# Patient Record
Sex: Male | Born: 1980
Health system: Southern US, Community
[De-identification: ages and names within clinical notes are randomized; demographics above are authoritative.]

## PROBLEM LIST (undated history)

## (undated) HISTORY — PX: BACK SURGERY: SHX140

## (undated) NOTE — *Deleted (*Deleted)
Family Medicine Teaching [facility] Admission History and Physical Service Pager: [phone]  Patient name: Gregory Foster Medical record number: [id number] Date of birth: [birth year] Age: 65 y.o. Gender: male  Primary Care Provider: Patient, No Pcp Per Consultants: None Code Status: FULL Preferred Emergency Contact: Nagee Goates (Wife) [phone]  Chief Complaint: Chest pain  Assessment and Plan: Gregory Foster is a 38 y.o. male presenting with chest pain. Patient has no known PMHx.  Exertional Chest Pain Patient presents with 2 weeks of worsening substernal chest pressure. He also endorses SOB and decreased exercise tolerance (able to walk 57ft before becoming fatigued with significant chest pain/SOB). On presentation patient is hypertensive with BP 155/101, but all other vitals wnl. Physical exam unremarkable, EKG unremarkable, CXR normal, and high sensitivity troponin negative x2 (3>3). He was given ASA 325mg  at urgent care, and nitro was ordered in the ED but not given yet. Differential diagnosis includes unstable angina (most likely), MI (less likely given negative troponin and EKG), musculoskeletal chest pain, PE (Wells score 0), aortic stenosis, CHF, GERD, and pneumonia (not seen on CXR). Patient is overall low risk for cardiac etiology (no family hx, nonsmoker, no known diabetes or HLD). -admit to med-tele, attending Dr. Deirdre Priest -Vitals per routine -OOB with assistance -Will obtain Lipid panel, Hemoglobin A1c -Repeat EKG in am -Consider echo -Consult cardiology in am -Will likely need stress test (possibly as outpatient) -PT/OT eval  Hypertension Patient has no diagnosed hx of HTN but has been checking his BP at home over the past 1-2 weeks where it has been elevated up to 180/129. BP in the ED has ranged from 140-166 systolic.  -Monitor BP -If persistently elevated, plan to start antihypertensive  Headache Patient complaining of bitemporal headache, described as a  pressure sensation. Most likely tension headache, but may also be related to hypertensive emergency given hx of significantly elevated BP at home. Low suspicion for intracranial pathology at this time. -Tylenol 1000mg  q6h prn   FEN/GI: Heart healthy diet Prophylaxis: Lovenox  Disposition: med-tele  History of Present Illness:  Gregory Foster is a 79 y.o. male, with no diagnosed PMH, presenting with progressive substernal chest pain for the past two weeks.     He is accompanied by his wife who provides additional history. Reports he has been feeling chest pressure for the past "two weeks or so" with exertion only. Last night felt winded with minimal exertion and like a "person was sitting on his chest." Minimal chest pain remaining right now, reports the pain is present at all times but becomes severe with exertion, associated with SOB. He is feeling the chest pressure and SOB quicker with less activity since this started (now reports he can walk about 60 ft before feeling tired), but that the pain is no more severe than when initially started. Baseline no restrictions. Significantly improves with rest. No radiation of pain. Has been checking BP at home- ranges from 160-180 systolic and 90-129 diastolic. Concurrent headache (all over), gets worse when his blood pressure is high. He tried potassium citrate to improve his symptoms with some improvement. Denies any fever, chills, cough, sore throat, diaphoresis. No known sick contacts, not vaccinated against COVID. No LE swelling or change in weight. No difference in pain after certain foods, but wife says he has been burping "more deep" recently.   He also reports some muscle cramping and "feeling weak in my knees" for the past two weeks. No preceding injury or trauma. This cramping started prior to chest pains. Doesn't  drink much water during the day. Non-smoker. Social alcohol use.   He had two teeth pulled on Monday, started on amoxicillin and  ibuprofen. 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 No family history of CAD/MI, does have HTN and T2DM in the family. Not on any medications at home, does not have a PCP.   Ed Course: Initially presented to On arrival he was hemodynamically stable and in no acute distress. EKG sinus without ischemic changes noted. Q wave present in lead III only.    Review Of Systems: Per HPI with the following additions:  Review of Systems  Constitutional: Negative for diaphoresis, fever and unexpected weight change.  HENT: Negative for congestion and sore throat.   Respiratory: Positive for shortness of breath. Negative for cough.   Cardiovascular: Positive for chest pain. Negative for leg swelling.  Gastrointestinal: Negative for abdominal pain, nausea and vomiting.  Skin: Negative for rash.  Neurological: Positive for headaches. Negative for syncope, weakness and numbness.     There are no problems to display for this patient.   Past Medical History: History reviewed. No pertinent past medical history.  Past Surgical History: Past Surgical History:  Procedure Laterality Date  . BACK SURGERY      Social History: Social History   Tobacco Use  . Smoking status: Nonsmoker  Substance Use Topics  . Alcohol use: Occasional  . Drug use: None    Family History: HTN in Mother Diabetes in Uncle  Allergies and Medications: No Known Allergies No current facility-administered medications on file prior to encounter.   Current Outpatient Medications on File Prior to Encounter  Medication Sig Dispense Refill  . amoxicillin (AMOXIL) 875 MG tablet Take 875 mg by mouth 2 (two) times daily.    Marland Kitchen ibuprofen (ADVIL) 600 MG tablet Take 600 mg by mouth every 6 (six) hours as needed for pain.      Objective: BP (!) 166/92 (BP Location: Right Arm)   Pulse (!) 57   Temp 97.8 F (36.6 C) (Oral)   Resp 15   SpO2 97%  Exam: General: alert,  well-appearing, NAD Eyes: PERRL, EOMI ENTM: moist mucous membranes Neck: supple, no cervical lymphadenopathy Cardiovascular: RRR, normal S1/S2 without m/r/g, chest wall nontender to palpation, no peripheral edema Respiratory: normal WOB, lungs CTAB Gastrointestinal: +BS, soft, nontender, nondistended MSK: full ROM in all extremities, 5/5 strength in bilateral upper and lower extremities, chest wall and bilateral shoulders nontender to palpation Derm: no rashes noted Neuro: CN II-VII intact, normal sensation throughout Psych: appropriate affect, normal speech  Labs and Imaging: CBC BMET  Recent Labs  Lab 07/07/20 1627  WBC 8.0  HGB 13.9  HCT 42.3  PLT 323   Recent Labs  Lab 07/07/20 1627  NA 136  K 3.8  CL 103  CO2 27  BUN 12  CREATININE 1.00  GLUCOSE 93  CALCIUM 9.4    Troponin: 3>3  EKG: NSR at 60 bpm, no ST-T changes  DG Chest 2 View Result Date: 07/07/2020 IMPRESSION: No active cardiopulmonary disease.    Maury Dus, MD 07/07/2020, 9:44 PM PGY-1, [facility] Family Medicine FPTS Intern pager: [phone], text pages welcome

---

## 2007-10-10 ENCOUNTER — Emergency Department (HOSPITAL_COMMUNITY): Admission: EM | Admit: 2007-10-10 | Discharge: 2007-10-10 | Payer: Self-pay | Admitting: Emergency Medicine

## 2008-08-21 HISTORY — PX: BACK SURGERY: SHX140

## 2011-05-15 LAB — URINALYSIS, ROUTINE W REFLEX MICROSCOPIC
Nitrite: NEGATIVE
Protein, ur: 30 — AB
Specific Gravity, Urine: 1.031 — ABNORMAL HIGH
Urobilinogen, UA: 1

## 2011-05-15 LAB — I-STAT 8, (EC8 V) (CONVERTED LAB)
Chloride: 103
HCT: 45
Potassium: 3.4 — ABNORMAL LOW
Sodium: 137
TCO2: 25
pH, Ven: 7.409 — ABNORMAL HIGH

## 2011-05-15 LAB — URINE CULTURE
Colony Count: NO GROWTH
Culture: NO GROWTH

## 2011-05-15 LAB — DIFFERENTIAL
Basophils Absolute: 0
Basophils Relative: 0
Lymphocytes Relative: 5 — ABNORMAL LOW
Monocytes Absolute: 0.7
Monocytes Relative: 4
Neutro Abs: 16.1 — ABNORMAL HIGH
Neutrophils Relative %: 91 — ABNORMAL HIGH

## 2011-05-15 LAB — URINE MICROSCOPIC-ADD ON

## 2011-05-15 LAB — CBC
Hemoglobin: 13.7
RBC: 4.74

## 2011-05-15 LAB — POCT I-STAT CREATININE
Creatinine, Ser: 1.1
Operator id: 114141

## 2020-07-07 ENCOUNTER — Emergency Department (HOSPITAL_COMMUNITY): Payer: Self-pay

## 2020-07-07 ENCOUNTER — Observation Stay (HOSPITAL_COMMUNITY)
Admission: EM | Admit: 2020-07-07 | Discharge: 2020-07-09 | Disposition: A | Discharge status: Home / Self Care | Payer: Self-pay | Attending: Family Medicine | Admitting: Family Medicine

## 2020-07-07 ENCOUNTER — Other Ambulatory Visit: Payer: Self-pay

## 2020-07-07 ENCOUNTER — Encounter (HOSPITAL_COMMUNITY): Payer: Self-pay | Admitting: Emergency Medicine

## 2020-07-07 DIAGNOSIS — R079 Chest pain, unspecified: Secondary | ICD-10-CM | POA: Diagnosis present

## 2020-07-07 DIAGNOSIS — Z20822 Contact with and (suspected) exposure to covid-19: Secondary | ICD-10-CM | POA: Insufficient documentation

## 2020-07-07 DIAGNOSIS — I200 Unstable angina: Principal | ICD-10-CM | POA: Insufficient documentation

## 2020-07-07 LAB — CBC
HCT: 42.3 % (ref 39.0–52.0)
Hemoglobin: 13.9 g/dL (ref 13.0–17.0)
MCH: 28.4 pg (ref 26.0–34.0)
MCHC: 32.9 g/dL (ref 30.0–36.0)
MCV: 86.5 fL (ref 80.0–100.0)
Platelets: 323 10*3/uL (ref 150–400)
RBC: 4.89 MIL/uL (ref 4.22–5.81)
RDW: 12.6 % (ref 11.5–15.5)
WBC: 8 10*3/uL (ref 4.0–10.5)
nRBC: 0 % (ref 0.0–0.2)

## 2020-07-07 LAB — TROPONIN I (HIGH SENSITIVITY)
Troponin I (High Sensitivity): 3 ng/L (ref <18)
Troponin I (High Sensitivity): 3 ng/L (ref <18)

## 2020-07-07 LAB — BASIC METABOLIC PANEL
Anion gap: 6 (ref 5–15)
BUN: 12 mg/dL (ref 6–20)
CO2: 27 mmol/L (ref 22–32)
Calcium: 9.4 mg/dL (ref 8.9–10.3)
Chloride: 103 mmol/L (ref 98–111)
Creatinine, Ser: 1 mg/dL (ref 0.61–1.24)
GFR, Estimated: >60 mL/min (ref >60)
Glucose, Bld: 93 mg/dL (ref 70–99)
Potassium: 3.8 mmol/L (ref 3.5–5.1)
Sodium: 136 mmol/L (ref 135–145)

## 2020-07-07 MED ORDER — ONDANSETRON HCL 4 MG/2ML IJ SOLN: 4.0000 mg | Freq: Four times a day (QID) | INTRAMUSCULAR | Status: DC | PRN

## 2020-07-07 MED ORDER — ENOXAPARIN SODIUM 40 MG/0.4ML ~~LOC~~ SOLN: 40.0000 mg | Freq: Every day | SUBCUTANEOUS | Status: DC

## 2020-07-07 MED ORDER — ACETAMINOPHEN 500 MG PO TABS
1000.0000 mg | ORAL_TABLET | Freq: Four times a day (QID) | ORAL | Status: DC | PRN
  Administered 2020-07-08 (×2): 1000 mg via ORAL
  Filled 2020-07-07 (×2): qty 2

## 2020-07-07 MED ORDER — NITROGLYCERIN 0.4 MG SL SUBL: 0.4000 mg | SUBLINGUAL_TABLET | SUBLINGUAL | Status: DC | PRN

## 2020-07-07 MED ORDER — AMOXICILLIN 500 MG PO CAPS
1000.0000 mg | ORAL_CAPSULE | Freq: Two times a day (BID) | ORAL | Status: DC
  Administered 2020-07-08 – 2020-07-09 (×4): 1000 mg via ORAL
  Filled 2020-07-07 (×5): qty 2

## 2020-07-07 NOTE — ED Provider Notes (Signed)
Gregory Foster Provider Note  CSN: [id number] Arrival date & time: 07/07/20 1613    History Chief Complaint  Patient presents with  . Chest Pain    HPI  Gregory Foster is a 39 y.o. male with no significant PMH (although he also rarely goes to the doctor) presents from [facility] for evaluation of chest pain. He reports at work about a week ago he had an episode of pain in his L upper back and shoulder, worse with arm movement that resolved after about a day. He has also had midsternal chest pressure, associated with SOB, worse with exertion and improved with rest. He reports some continued mild pressure at the time of my evaluation. Denies any fever, cough, nausea or diaphoresis. He has had elevated BP readings at home. Was seen at [facility] today, given ASA x 4 and referred to the ED.    History reviewed. No pertinent past medical history.  Past Surgical History:  Procedure Laterality Date  . BACK SURGERY      No family history on file.  Social History   Tobacco Use  . Smoking status: Not on file  Substance Use Topics  . Alcohol use: Not on file  . Drug use: Not on file     Home Medications Prior to Admission medications   Medication Sig Start Date End Date Taking? Authorizing Provider  amoxicillin (AMOXIL) 875 MG tablet Take 875 mg by mouth 2 (two) times daily. 07/05/20  Yes [provider]  ibuprofen (ADVIL) 600 MG tablet Take 600 mg by mouth every 6 (six) hours as needed for pain. 07/05/20  Yes [provider]     Allergies    Patient has no known allergies.   Review of Systems   Review of Systems A comprehensive review of systems was completed and negative except as noted in HPI.    Physical Exam BP (!) 141/84 (BP Location: Right Arm)   Pulse 65   Temp 97.8 F (36.6 C) (Oral)   Resp 20   SpO2 92%   Physical Exam Vitals and nursing note reviewed.  Constitutional:      Appearance: Normal appearance.  HENT:     Head:  Normocephalic and atraumatic.     Nose: Nose normal.     Mouth/Throat:     Mouth: Mucous membranes are moist.  Eyes:     Extraocular Movements: Extraocular movements intact.     Conjunctiva/sclera: Conjunctivae normal.  Cardiovascular:     Rate and Rhythm: Normal rate.  Pulmonary:     Effort: Pulmonary effort is normal.     Breath sounds: Normal breath sounds.  Abdominal:     General: Abdomen is flat.     Palpations: Abdomen is soft.     Tenderness: There is no abdominal tenderness.  Musculoskeletal:        General: No swelling. Normal range of motion.     Cervical back: Neck supple.  Skin:    General: Skin is warm and dry.  Neurological:     General: No focal deficit present.     Mental Status: He is alert.  Psychiatric:        Mood and Affect: Mood normal.      ED Results / Procedures / Treatments   Labs (all labs ordered are listed, but only abnormal results are displayed) Labs Reviewed  RESPIRATORY PANEL BY RT PCR (FLU A&B, COVID)  BASIC METABOLIC PANEL  CBC  LIPID PANEL  HEMOGLOBIN A1C  TROPONIN I (HIGH  SENSITIVITY)  TROPONIN I (HIGH SENSITIVITY)    EKG EKG Interpretation  Date/Time:  Wednesday July 07 2020 16:16:57 EST Ventricular Rate:  64 PR Interval:  152 QRS Duration: 102 QT Interval:  410 QTC Calculation: 422 R Axis:   95 Text Interpretation: Normal sinus rhythm Rightward axis Nonspecific ST abnormality Abnormal ECG No old tracing to compare Confirmed by [name] [phone]) on 07/07/2020 8:47:17 PM   Radiology DG Chest 2 View  Result Date: 07/07/2020 CLINICAL DATA:  Chest pain EXAM: CHEST - 2 VIEW COMPARISON:  None. FINDINGS: The heart size and mediastinal contours are within normal limits. Both lungs are clear. The visualized skeletal structures are unremarkable. IMPRESSION: No active cardiopulmonary disease. Electronically Signed   By: Charlett Nose M.D.   On: 07/07/2020 17:37    Procedures Procedures  Medications Ordered in the  ED Medications  nitroGLYCERIN (NITROSTAT) SL tablet 0.4 mg (has no administration in time range)     MDM Rules/Calculators/A&P MDM Patient with no known risk factors, but also rarely goes to the doctor here with concerning story for anginal chest pain. Initial EKG without ischemic changes and first two trop are neg. Given his continued mild 1-2/10 chest pressure, will give NTG and admit for further evaluation.  ED Course  I have reviewed the triage vital signs and the nursing notes.  Pertinent labs & imaging results that were available during my care of the patient were reviewed by me and considered in my medical decision making (see chart for details).  Clinical Course as of Jul 07 2218  Wed Jul 07, 2020  2126 CBC, Trop, BMP and CXR all normal.    [CS]  2158 Spoke with Dr. Anner Crete, FM Resident who will evaluate for admission.    [CS]    Clinical Course User Index [CS] Pollyann Savoy, MD    Final Clinical Impression(s) / ED Diagnoses Final diagnoses:  Unstable angina [facility])    Rx / DC Orders ED Discharge Orders    None       Pollyann Savoy, MD 07/07/20 2219

## 2020-07-07 NOTE — ED Triage Notes (Signed)
Pt here pov from UC with co cp x1 week upon activity. When pt rests his pain subsides.

## 2020-07-07 NOTE — H&P (Addendum)
Family Medicine Teaching [facility] Admission History and Physical Service Pager: [phone]  Patient name: Gregory Foster Medical record number: [id number] Date of birth: 29-Aug-1980 Age: 39 y.o. Gender: male  Primary Care Provider: Patient, No Pcp Per Consultants: None Code Status: FULL Preferred Emergency Contact: Alexandra Posadas (Wife) [phone]  Chief Complaint: Chest pain  Assessment and Plan: Gregory Foster is a 39 y.o. male presenting with chest pain. Patient has no known PMHx.  Exertional Chest Pain: Acute.  Patient presents with 2 weeks of worsening exertional substernal chest pressure. He also endorses SOB and decreased activity tolerance, resolves at rest. On presentation patient is hypertensive with BP 155/101, but all other vitals wnl. He has little to no chest pain currently. Physical exam unremarkable, EKG without ischemic changes, CXR normal, and high sensitivity troponin negative x2 (3>3). He was given ASA 325mg  at urgent care, and nitro was ordered in the ED but not given yet. His presentation is most concerning for anginal pain/CAD (most likely). Heart score of 2, patient is overall low risk for cardiac etiology (no family or personal hx, nonsmoker, no known diabetes or HLD). However, differential also includes MI (less likely given negative troponin x2 and EKG wnl), 2/2 uncontrolled hypertension, musculoskeletal chest pain, PE (Wells score 0), CHF (however no additonal stigmata suggestive of this), GERD, and pneumonia (doubt, afebrile, not seen on CXR).  -Admit to cardiac-tele, attending Dr. -Vitals per routine -OOB with assistance -Will obtain Lipid panel, Hemoglobin A1c  -Repeat EKG in am, or PRN chest pain  -Consult cardiology in am for further evaluation  -Can consider Echo, however will wait until cardiology discussion  -PT eval  Elevated blood pressure w/o diagnosis of hypertension Patient has no diagnosed hx of HTN but has been checking his BP at home  over the past 1-2 weeks where it has been elevated up to 180/129. Current BP 134/87, ranging 130-160 since arrival.  -Monitor BP -If persistently elevated, plan to start antihypertensive (as expect underlying hypertension)   Headache Patient complaining of bitemporal headache, described as a pressure sensation. Most likely tension headache, but may also be related to hypertension given hx of significantly elevated BP at home. Low suspicion for intracranial pathology at this time, neurologically intact. -Tylenol 1000mg  q6h prn -kpad or ice PRN   Recent tooth removal:  Reports two teeth extractions on this Monday, 11/15, and subsequently initiated on 5 day course of amoxicillin. No current gum irritation or pain.  --Cont amoxicillin BID (11/15-11/19)   FEN/GI: Heart healthy diet Prophylaxis: Lovenox  Disposition: med-tele  History of Present Illness:  Gregory Foster is a 39 y.o. male, with no diagnosed PMH, presenting with progressive substernal chest pain for the past two weeks.     He is accompanied by his wife who provides additional history. Reports he has been feeling chest pressure for the past "two weeks or so" with exertion only. Last night felt winded with minimal exertion and like a "person was sitting on his chest." Minimal chest pain remaining right now, reports the pain is present at all times but becomes severe with exertion, associated with SOB. He is feeling the chest pressure and SOB quicker with less activity since this started (now reports he can walk about 60 ft before feeling tired), but that the pain is no more severe than when initially started. Baseline no restrictions. Resolves with rest. No radiation of pain. Has been checking BP at home- ranges from 160-180 systolic and 90-129 diastolic. Concurrent headache (all over), gets worse when  his blood pressure is high. He tried potassium citrate to improve his symptoms with some improvement. Denies any fever, chills, cough, sore  throat, diaphoresis, N/V. No known sick contacts, not vaccinated against COVID. No LE swelling or change in weight. No difference in pain after certain foods, but wife says he has been burping "more deep" recently.   He also reports some muscle cramping and "feeling weak in my knees" for the past two weeks. No preceding injury or trauma. This cramping started prior to chest pains. Doesn't drink much water during the day. Non-smoker. Social alcohol use.   He had two teeth pulled on Monday, started on amoxicillin and ibuprofen. 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 No family history of CAD/MI, does have HTN and T2DM in the family. Not on any medications at home, does not have a PCP.   Ed Course: Initially presented to UC, given ASA and sent to ED for further evaluation. On arrival he was hemodynamically stable and in no acute distress. EKG sinus without ischemic changes noted. Q wave present in lead III only. HS Troponin <3 x2. Heart score of 2. FPTS team was consulted for admission for further observation overnight given typical anginal symptoms.    Review Of Systems: Per HPI with the following additions:  Review of Systems  Constitutional: Negative for diaphoresis, fever and unexpected weight change.  HENT: Negative for congestion and sore throat.   Respiratory: Positive for shortness of breath. Negative for cough.   Cardiovascular: Positive for chest pain. Negative for leg swelling.  Gastrointestinal: Negative for abdominal pain, nausea and vomiting.  Skin: Negative for rash.  Neurological: Positive for headaches. Negative for syncope, weakness and numbness.     Patient Active Problem List   Diagnosis Date Noted  . Chest pain 07/07/2020    Past Medical History: History reviewed. No pertinent past medical history.  Past Surgical History: Past Surgical History:  Procedure Laterality Date  . BACK SURGERY      Social  History: Social History   Tobacco Use  . Smoking status: Nonsmoker  Substance Use Topics  . Alcohol use: Occasional  . Drug use: None    Family History: HTN in Mother Diabetes in Uncle  Allergies and Medications: No Known Allergies No current facility-administered medications on file prior to encounter.   Current Outpatient Medications on File Prior to Encounter  Medication Sig Dispense Refill  . amoxicillin (AMOXIL) 875 MG tablet Take 875 mg by mouth 2 (two) times daily.    Marland Kitchen ibuprofen (ADVIL) 600 MG tablet Take 600 mg by mouth every 6 (six) hours as needed for pain.      Objective: BP 135/82   Pulse 61   Temp 97.8 F (36.6 C) (Oral)   Resp 13   SpO2 94%  Exam: General: alert, well-appearing, NAD laying comfortably  Eyes: PERRL, EOMI ENTM: moist mucous membranes Neck: supple, no cervical lymphadenopathy Cardiovascular: RRR, normal S1/S2 without m/r/g, chest wall nontender to palpation, no peripheral edema Respiratory: normal WOB, lungs CTAB Gastrointestinal: +BS, soft, nontender, nondistended MSK: full ROM in all extremities, 5/5 strength in bilateral upper and lower extremities, chest wall and bilateral shoulders nontender to palpation Derm: no rashes noted Neuro: Alert and oriented, CN II-VII intact, normal sensation throughout Psych: appropriate affect, normal speech  Labs and Imaging: CBC BMET  Recent Labs  Lab 07/07/20 1627  WBC 8.0  HGB 13.9  HCT 42.3  PLT 323  Recent Labs  Lab 07/07/20 1627  NA 136  K 3.8  CL 103  CO2 27  BUN 12  CREATININE 1.00  GLUCOSE 93  CALCIUM 9.4    Troponin: 3>3  EKG: NSR at 60 bpm, no ST-T changes  DG Chest 2 View Result Date: 07/07/2020 IMPRESSION: No active cardiopulmonary disease.   Maury Dus, MD 07/08/2020, 12:04 AM PGY-1, [facility] Family Medicine FPTS Intern pager: [phone], text pages welcome  FPTS Upper-Level Resident Addendum   I have independently interviewed and examined the patient.  I have discussed the above with the original author and agree with their documentation. My edits for correction/addition/clarification are added. Please see also any attending notes.    Allayne Stack, DO  Family Medicine PGY-3

## 2020-07-08 ENCOUNTER — Observation Stay (HOSPITAL_BASED_OUTPATIENT_CLINIC_OR_DEPARTMENT_OTHER): Payer: Self-pay

## 2020-07-08 ENCOUNTER — Observation Stay (HOSPITAL_COMMUNITY): Payer: Self-pay

## 2020-07-08 DIAGNOSIS — I200 Unstable angina: Secondary | ICD-10-CM

## 2020-07-08 DIAGNOSIS — I10 Essential (primary) hypertension: Secondary | ICD-10-CM

## 2020-07-08 DIAGNOSIS — R079 Chest pain, unspecified: Secondary | ICD-10-CM

## 2020-07-08 DIAGNOSIS — R0602 Shortness of breath: Secondary | ICD-10-CM

## 2020-07-08 LAB — BASIC METABOLIC PANEL
Anion gap: 9 (ref 5–15)
BUN: 13 mg/dL (ref 6–20)
CO2: 27 mmol/L (ref 22–32)
Calcium: 9.5 mg/dL (ref 8.9–10.3)
Chloride: 104 mmol/L (ref 98–111)
Creatinine, Ser: 1.02 mg/dL (ref 0.61–1.24)
GFR, Estimated: >60 mL/min (ref >60)
Glucose, Bld: 106 mg/dL — ABNORMAL HIGH (ref 70–99)
Potassium: 4 mmol/L (ref 3.5–5.1)
Sodium: 140 mmol/L (ref 135–145)

## 2020-07-08 LAB — ECHOCARDIOGRAM COMPLETE
Area-P 1/2: 3.77 cm2
Height: 71 in
S' Lateral: 3.4 cm
Weight: 4566.4 oz

## 2020-07-08 LAB — RESPIRATORY PANEL BY RT PCR (FLU A&B, COVID)
Influenza A by PCR: NEGATIVE
Influenza B by PCR: NEGATIVE
SARS Coronavirus 2 by RT PCR: NEGATIVE

## 2020-07-08 LAB — HEMOGLOBIN A1C
Hgb A1c MFr Bld: 5.6 % (ref 4.8–5.6)
Mean Plasma Glucose: 114.02 mg/dL

## 2020-07-08 LAB — MAGNESIUM: Magnesium: 2.2 mg/dL (ref 1.7–2.4)

## 2020-07-08 LAB — LIPID PANEL
Cholesterol: 165 mg/dL (ref 0–200)
HDL: 24 mg/dL — ABNORMAL LOW (ref >40)
LDL Cholesterol: 114 mg/dL — ABNORMAL HIGH (ref 0–99)
Total CHOL/HDL Ratio: 6.9 RATIO
Triglycerides: 135 mg/dL (ref <150)
VLDL: 27 mg/dL (ref 0–40)

## 2020-07-08 LAB — HIV ANTIBODY (ROUTINE TESTING W REFLEX): HIV Screen 4th Generation wRfx: NONREACTIVE

## 2020-07-08 MED ORDER — NITROGLYCERIN 0.4 MG SL SUBL
SUBLINGUAL_TABLET | SUBLINGUAL | Status: AC
  Administered 2020-07-08: 0.8 mg
  Filled 2020-07-08: qty 2

## 2020-07-08 MED ORDER — IOHEXOL 350 MG/ML SOLN
80.0000 mL | Freq: Once | INTRAVENOUS | Status: AC | PRN
  Administered 2020-07-08: 80 mL via INTRAVENOUS

## 2020-07-08 MED ORDER — LOSARTAN POTASSIUM 25 MG PO TABS
25.0000 mg | ORAL_TABLET | Freq: Every day | ORAL | 0 refills | Status: DC
Start: 2020-07-09 — End: 2020-07-09

## 2020-07-08 MED ORDER — LOSARTAN POTASSIUM 25 MG PO TABS
25.0000 mg | ORAL_TABLET | Freq: Every day | ORAL | Status: DC
  Administered 2020-07-08 – 2020-07-09 (×2): 25 mg via ORAL
  Filled 2020-07-08 (×2): qty 1

## 2020-07-08 MED ORDER — ENOXAPARIN SODIUM 80 MG/0.8ML ~~LOC~~ SOLN
65.0000 mg | Freq: Every day | SUBCUTANEOUS | Status: DC
  Administered 2020-07-08 – 2020-07-09 (×2): 65 mg via SUBCUTANEOUS
  Filled 2020-07-08 (×2): qty 0.8

## 2020-07-08 MED ORDER — METOPROLOL TARTRATE 25 MG PO TABS
25.0000 mg | ORAL_TABLET | ORAL | Status: AC
  Administered 2020-07-08: 25 mg via ORAL
  Filled 2020-07-08: qty 1

## 2020-07-08 NOTE — Consult Note (Signed)
CARDIOLOGY CONSULT NOTE       Patient ID: Ruhaan Nordahl MRN: [id number] DOB/AGE: 03-23-1981 39 y.o.  Admit date: 07/07/2020 Referring Physician: Annia Friendly Primary Physician: Patient, No Pcp Per Primary Cardiologist: New Reason for Consultation: Chest Pain, Dyspnea  Active Problems:   Chest pain   HPI:  39 y.o. no cardiac history Likely HTN not RX. Been ill for a week. Started with muscular sounding pain in neck and left shoulder spread to chest. Exertional and rest. Started to get dyspnea. No cough fever or sick contacts. Feels fatigue and weak. 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 Not pleuritic no wheezing or history of asthma/smoking Legs feel heavy but no pain / edema. In ER CXR NAD ECG normal Troponin negative x 2. Headache with elevated BP. Had tooth extraction Monday 11/15 and has been on amoxicillin. No history of murmur SBE, rheumatic fever. No signs of systemic infection.   ROS All other systems reviewed and negative except as noted above  History reviewed. No pertinent past medical history.  No family history on file.  Social History   Socioeconomic History  . Marital status: Married    Spouse name: Not on file  . Number of children: Not on file  . Years of education: Not on file  . Highest education level: Not on file  Occupational History  . Not on file  Tobacco Use  . Smoking status: Not on file  Substance and Sexual Activity  . Alcohol use: Not on file  . Drug use: Not on file  . Sexual activity: Not on file  Other Topics Concern  . Not on file  Social History Narrative  . Not on file   Social Determinants of Health   Financial Resource Strain:   . Difficulty of Paying Living Expenses: Not on file  Food Insecurity:   . Worried About Programme researcher, broadcasting/film/video in the Last Year: Not on file  . Ran Out of Food in the Last Year: Not on file  Transportation Needs:   . Lack of  Transportation (Medical): Not on file  . Lack of Transportation (Non-Medical): Not on file  Physical Activity:   . Days of Exercise per Week: Not on file  . Minutes of Exercise per Session: Not on file  Stress:   . Feeling of Stress : Not on file  Social Connections:   . Frequency of Communication with Friends and Family: Not on file  . Frequency of Social Gatherings with Friends and Family: Not on file  . Attends Religious Services: Not on file  . Active Member of Clubs or Organizations: Not on file  . Attends Banker Meetings: Not on file  . Marital Status: Not on file  Intimate Partner Violence:   . Fear of Current or Ex-Partner: Not on file  . Emotionally Abused: Not on file  . Physically Abused: Not on file  . Sexually Abused: Not on file    Past Surgical History:  Procedure Laterality Date  . BACK SURGERY        Current Facility-Administered Medications:  .  acetaminophen (TYLENOL) tablet 1,000 mg, 1,000 mg, Oral, Q6H PRN, Leticia Penna N, DO, 1,000 mg at 07/08/20 0115 .  amoxicillin (AMOXIL) capsule 1,000 mg, 1,000 mg, Oral, BID, Beard, Samantha N, DO, 1,000 mg at 07/08/20 0208 .  enoxaparin (LOVENOX) injection 65 mg, 65 mg, Subcutaneous, Daily, Fayette Pho, MD .  metoprolol tartrate (LOPRESSOR) tablet 25 mg, 25 mg, Oral, NOW, Rosezella Kronick C, MD .  nitroGLYCERIN (NITROSTAT) SL tablet 0.4 mg, 0.4 mg, Sublingual, Q5 min PRN, Beard, Samantha N, DO .  ondansetron (ZOFRAN) injection 4 mg, 4 mg, Intravenous, Q6H PRN, Beard, Samantha N, DO . amoxicillin  1,000 mg Oral BID  . enoxaparin (LOVENOX) injection  65 mg Subcutaneous Daily  . metoprolol tartrate  25 mg Oral NOW     Physical Exam: Blood pressure (!) 141/77, pulse 60, temperature 97.8 F (36.6 C), temperature source Oral, resp. rate 16, height 5\' 11"  (1.803 m), weight 129.5 kg, SpO2 98 %.   Affect appropriate Healthy:  appears stated age HEENT: normal Neck supple with no adenopathy JVP normal  no bruits no thyromegaly Lungs clear with no wheezing and good diaphragmatic motion Heart:  S1/S2 no murmur, no rub, gallop or click PMI normal Abdomen: benighn, BS positve, no tenderness, no AAA no bruit.  No HSM or HJR Distal pulses intact with no bruits No edema Neuro non-focal Skin warm and dry No muscular weakness   Labs:   Lab Results  Component Value Date   WBC 8.0 07/07/2020   HGB 13.9 07/07/2020   HCT 42.3 07/07/2020   MCV 86.5 07/07/2020   PLT 323 07/07/2020    Recent Labs  Lab 07/08/20 0233  NA 140  K 4.0  CL 104  CO2 27  BUN 13  CREATININE 1.02  CALCIUM 9.5  GLUCOSE 106*   No results found for: CKTOTAL, CKMB, CKMBINDEX, TROPONINI  Lab Results  Component Value Date   CHOL 165 07/08/2020   Lab Results  Component Value Date   HDL 24 (L) 07/08/2020   Lab Results  Component Value Date   LDLCALC 114 (H) 07/08/2020   Lab Results  Component Value Date   TRIG 135 07/08/2020   Lab Results  Component Value Date   CHOLHDL 6.9 07/08/2020   No results found for: LDLDIRECT    Radiology: DG Chest 2 View  Result Date: 07/07/2020 CLINICAL DATA:  Chest pain EXAM: CHEST - 2 VIEW COMPARISON:  None. FINDINGS: The heart size and mediastinal contours are within normal limits. Both lungs are clear. The visualized skeletal structures are unremarkable. IMPRESSION: No active cardiopulmonary disease. Electronically Signed   By: 07/09/2020 M.D.   On: 07/07/2020 17:37    EKG: NSR normal no acute ST changes or signs of pericarditis    ASSESSMENT AND PLAN:   1. Chest pain. Normal ECG, negative troponin. Normal CXR. Persistent and worsening Favor cardiac CTA to risk stratify as his HR is relatively low and regular Discussed with patient will arrange Needs lopressor 25 mg oral and 18 g iv No contrast allergy Warned him nitro may make headache worse again  2. Dyspnea:  Normal exam and CXR Echo to r/o viral DCM check RV/LV function doubt PE R./O SBE with recent dental  surgery no murmur on exam   3. Headache:  ? Tension per primary service no focal signs no need for CT head Rx tylenol and control BP  4. HTN:  Start Cozaar 25 mg daily   Signed: 07/09/2020 39 07/08/2020, 10:09 AM

## 2020-07-08 NOTE — Hospital Course (Addendum)
Gregory Foster is a 39 y.o. male presenting with chest pain. Patient has no known PMHx but also has not visited a PCP in quite some time.    Exertional Chest Pain: Acute.  Patient presents with 2 weeks of worsening exertional substernal chest pressure. He also endorses SOB and decreased activity tolerance, resolves at rest. On presentation patient is hypertensive with BP 155/101, but all other vitals wnl. He has little to no chest pain currently. Physical exam unremarkable, EKG without ischemic changes, CXR normal, and high sensitivity troponin negative x2 (3>3). He was given ASA 383m at urgent care, and nitro was ordered in the ED. His presentation is most concerning for anginal pain/CAD (most likely). Heart score of 2, patient is overall low risk for cardiac etiology (no family or personal hx, nonsmoker, no known diabetes or HLD). However, differential also includes MI (less likely given negative troponin x2 and EKG wnl), 2/2 uncontrolled hypertension, musculoskeletal chest pain, PE (Wells score 0), CHF (however no additonal stigmata suggestive of this), GERD, and pneumonia (doubt, afebrile, not seen on CXR).  Patient was admitted to cardiac telemetry floor.  Cardiology was consulted, performed echo and cardiac CTA.  Echo demonstrated EF 60 to 65%, no regional wall motion normalities of left ventricle, normal LV function, normal RV function, normal valve structure, normal IVC with appropriate respiratory variability. Cardiac CTA showed no calcium in arteries (calcium score 0), right dominant coronary artery structure with no anomalies, CAD rads0. Also showed all vessels are normal. Patient required no oxygen while admitted.  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  Cardiology recommended starting losartan 20 mg daily, prescription sent to pharmacy.  Patient should follow up with cardiology outpatient.    Elevated blood pressure w/o diagnosis of  hypertension Patient has no diagnosed hx of HTN but has been checking his BP at home over the past 1-2 weeks where it has been elevated up to 180/129.  Blood pressure during admission ranged (118-166)/(62-92) with pulse 54-67.  Elevated BPs mostly 140s / 70-80s.  Cardiology recommended starting losartan 5 mg daily as mentioned above.    AMA Patient left AMA the afternoon of Friday 11/19. Friday morning during prerounds, resident met patient's wife for the first time at bedside. She relayed dissatisfaction with the work-up thus far and not yet having the chance to reproduce the chest pain with exertion. She was adamant about "something being wrong" and that perhaps we need to expand our search.  We discussed the good news that there is no evidence of acute myocardial infarction, structural issues, coronary artery calcification, or altered heart function.  I assured them that the attending would visit after rounds to discuss the work-up thus far and potential further work-up.  Later around lunch, attending visited patient and his wife to discuss everything.  Attending also ambulated patient in hallway and measured blood pressure and pulse ox after, was unable to reproduce anginal chest pain.  Patient and his wife were calm and agreeable at the time, did not voice any additional complaints.  About an hour later, the nurse called the attending back to the room because the patient and his wife had become upset.  Upon entering the room, the attending found the patient and his wife with elevated voices; the patient made a vague threat toward the attending.  Patient and wife then proceeded to collect their belongings and walk out without signing [email or web address].

## 2020-07-08 NOTE — Discharge Instructions (Addendum)
Angina ° °Angina is very bad discomfort or pain in the chest, neck, arm, jaw, or back. The discomfort is caused by a lack of blood in the middle layer of the heart wall (myocardium). °What are the causes? °This condition is caused by a buildup of fat and cholesterol (plaque) in your arteries (atherosclerosis). This buildup narrows the arteries and makes it hard for blood to flow. °What increases the risk? °You are more likely to develop this condition if: °· You have high levels of cholesterol in your blood. °· You have high blood pressure (hypertension). °· You have diabetes. °· You have a family history of heart disease. °· You are not active, or you do not exercise enough. °· You feel sad (depressed). °· You have been treated with high energy rays (radiation) on the left side of your chest. °Other risk factors are: °· Using tobacco. °· Being very overweight (obese). °· Eating a diet high in unhealthy fats (saturated fats). °· Having stress, or being exposed to things that cause stress. °· Using drugs, such as cocaine. °Women have a greater risk for angina if: °· They are older than 55. °· They have stopped having their period (are in postmenopause). °What are the signs or symptoms? °Foster symptoms of this condition in both men and women may include: °· Chest pain, which may: °? Feel like a crushing or squeezing in the chest. °? Feel like a tightness, pressure, fullness, or heaviness in the chest. °? Last for more than a few minutes at a time. °? Stop and come back (recur) after a few minutes. °· Pain in the neck, arm, jaw, or back. °· Heartburn or upset stomach (indigestion) for no reason. °· Being short of breath. °· Feeling sick to your stomach (nauseous). °· Sudden cold sweats. °Women and people with diabetes may have other symptoms that are not usual, such as feeling: °· Tired (fatigue). °· Worried or nervous (anxious) for no reason. °· Weak for no reason. °· Dizzy or passing out (fainting). °How is this  treated? °This condition may be treated with: °· Medicines. These are given to: °? Prevent blood clots. °? Prevent heart attack. °? Relax blood vessels and improve blood flow to the heart (nitrates). °? Reduce blood pressure. °? Improve the pumping action of the heart. °? Reduce fat and cholesterol in the blood. °· A procedure to widen a narrowed or blocked artery in the heart (angioplasty). °· Surgery to allow blood to go around a blocked artery (coronary artery bypass surgery). °Follow these instructions at home: °Medicines °· Take over-the-counter and prescription medicines only as told by your doctor. °· Do not take these medicines unless your doctor says that you can: °? NSAIDs. These include: °§ Ibuprofen. °§ Naproxen. °? Vitamin supplements that have vitamin A, vitamin E, or both. °? Hormone therapy that contains estrogen with or without progestin. °Eating and drinking ° °· Eat a heart-healthy diet that includes: °? Lots of fresh fruits and vegetables. °? Whole grains. °? Low-fat (lean) protein. °? Low-fat dairy products. °· Follow instructions from your doctor about what you cannot eat or drink. °Activity °· Follow an exercise program that your doctor tells you. °· Talk with your doctor about joining a program to help improve the health of your heart (cardiac rehab). °· When you feel tired, take a break. Plan breaks if you know you are going to feel tired. °Lifestyle ° °· Do not use any products that contain nicotine or tobacco. This includes cigarettes, e-cigarettes, and   and chewing tobacco. If you need help quitting, ask your doctor.  If your doctor says you can drink alcohol: ? Limit how much you use to:  0-1 drink a day for women who are not pregnant.  0-2 drinks a day for men. ? Be aware of how much alcohol is in your drink. In the [place] one drink equals:  One 12 oz bottle of beer (355 mL).  One 5 oz glass of wine (148 mL).  One 1 oz glass of hard liquor (44 mL). General instructions  Stay  at a healthy weight. If your doctor tells you to do so, work with him or her to lose weight.  Learn to deal with stress. If you need help, ask your doctor.  Keep your vaccines up to date. Get a flu shot every year.  Talk with your doctor if you feel sad. Take a screening test to see if you are at risk for depression.  Work with your doctor to manage any other health problems that you have. These may include diabetes or high blood pressure.  Keep all follow-up visits as told by your doctor. This is important. Get help right away if:  You have pain in your chest, neck, arm, jaw, or back, and the pain: ? Lasts more than a few minutes. ? Comes back. ? Does not get better after you take medicine under your tongue (sublingual nitroglycerin). ? Keeps getting worse. ? Comes more often.  You have any of these problems for no reason: ? Sweating a lot. ? Heartburn or upset stomach. ? Shortness of breath. ? Trouble breathing. ? Feeling sick to your stomach. ? Throwing up (vomiting). ? Feeling more tired than normal. ? Feeling nervous or worrying more than normal. ? Weakness.  You are suddenly dizzy or light-headed.  You pass out. These symptoms may be an emergency. Do not wait to see if the symptoms will go away. Get medical help right away. Call your local emergency services (911 in the [place] Do not drive yourself to the hospital. Summary  Angina is very bad discomfort or pain in the chest, neck, arm, neck, or back.  Symptoms include chest pain, heartburn or upset stomach for no reason, and shortness of breath.  Women or people with diabetes may have symptoms that are not usual, such as feeling nervous or worried for no reason, weak for no reason, or tired.  Take all medicines only as told by your doctor.  You should eat a heart-healthy diet and follow an exercise program. This information is not intended to replace advice given to you by your health care provider. Make sure you  discuss any questions you have with your health care provider. Document Revised: 03/25/2018 Document Reviewed: 03/25/2018 Elsevier Patient Education  2020 [employer] Dear Gregory Foster,   Thank you for letting us participate in your care! In this section, you will find a brief hospital admission summary of why you were admitted to the hospital, what happened during your admission, your diagnosis/diagnoses, and recommended follow up.   You were admitted because you were experiencing chest pain. Your initial work up was negative. Cardiology was consulted because of your worsening symptoms over the last weeks. You were sent for two different scans of your heart. One scan looked at the heart arteries to see if anything was clogged, and it came back completely normal. The other heart scan was to evaluate the structure of your heart and how well it was functioning. This test was  also normal. Cardiology recommended outpatient follow up.    The cardiologist started you on a medication called Losartan for high blood pressure. 30 days have been sent to the pharmacy.  Please follow up with them about continuation of this medicaiton.   You were also referred to a new primary care provider to continue to manage your other medical conditions which include mildly elevated cholesterol and high blood pressure.   Your testing was negative for any emergent or life threatening issue and you were discharged from the hospital.   POST-HOSPITAL & CARE INSTRUCTIONS 1. Please follow up with your new PCP, Gregory Foster at the appointment listed below.  2. The cardiology team will reach out to you for a follow up appointment.  3. Please see medications section of this packet for any medication changes.   DOCTOR'S APPOINTMENT & FOLLOW UP CARE INSTRUCTIONS  Future Appointments  Date Time Provider Department Center  08/04/2020  1:50 PM Anders Simmonds, PA-C [facility] None     Thank you for choosing [facility]! Take care and be well!  Family Medicine Teaching Service Inpatient Team [place]  [facility]  [address] [place], [place] [address] [phone]   Angina  Angina is extreme discomfort in the chest, neck, arm, jaw, or back. The discomfort is caused by a lack of blood in the middle layer of the heart wall (myocardium). There are four types of angina:  Stable angina. This is triggered by vigorous activity or exercise. It goes away when you rest or take angina medicine.  Unstable angina. This is a warning sign and can lead to a heart attack (acute coronary syndrome). This is a medical emergency. Symptoms come at rest and last a long time.  Microvascular angina. This affects the small coronary arteries. Symptoms include feeling tired and being short of breath.  Prinzmetal or variant angina. This is caused by a tightening (spasm) of the arteries that go to your heart. What are the causes? This condition is caused by atherosclerosis. This is the buildup of fat and cholesterol (plaque) in your arteries. The plaque may narrow or block the artery. Other causes of angina include:  Sudden tightening of the muscles of the arteries in the heart (coronary spasm).  Small artery disease (microvascular dysfunction).  Problems with any of your heart valves (heart valve disease).  A tear in an artery in your heart (coronary artery dissection).  Diseases of the heart muscle (cardiomyopathy), or other heart diseases. What increases the risk? You are more likely to develop this condition if you have:  High cholesterol.  High blood pressure (hypertension).  Diabetes.  A family history of heart disease.  An inactive (sedentary) lifestyle, or you do not exercise enough.  Depression.  Had radiation treatment to the left side of your chest. Other risk factors include:  Using tobacco.  Being obese.  Eating a diet high in saturated fats.  Being exposed to  high stress or triggers of stress.  Using drugs, such as cocaine. Women have a greater risk for angina if:  They are older than 65.  They have gone through menopause (are postmenopausal). What are the signs or symptoms? Foster symptoms of this condition in both men and women may include:  Chest pain, which may: ? Feel like a crushing or squeezing in the chest, or like a tightness, pressure, fullness, or heaviness in the chest. ? Last for more than a few minutes at a time, or it  may stop and come back (recur) over the course of a few minutes.  Pain in the neck, arm, jaw, or back.  Unexplained heartburn or indigestion.  Shortness of breath.  Nausea.  Sudden cold sweats. Women and people with diabetes may have unusual (atypical) symptoms, such as:  Fatigue.  Unexplained feelings of nervousness or anxiety.  Unexplained weakness.  Dizziness or fainting. How is this diagnosed? This condition may be diagnosed based on:  Your symptoms and medical history.  Electrocardiogram (ECG) to measure the electrical activity in your heart.  Blood tests.  Stress test to look for signs of blockage when your heart is stressed.  CT angiogram to examine your heart and the blood flow to it.  Coronary angiogram to check your coronary arteries for blockage. How is this treated? Angina may be treated with:  Medicines to: ? Prevent blood clots and heart attack. ? Relax blood vessels and improve blood flow to the heart (nitrates). ? Reduce blood pressure, improve the pumping action of the heart, and relax blood vessels that are spasming. ? Reduce cholesterol and help treat atherosclerosis.  A procedure to widen a narrowed or blocked coronary artery (angioplasty). A mesh tube may be placed in a coronary artery to keep it open (coronary stenting).  Surgery to allow blood to go around a blocked artery (coronary artery bypass surgery). Follow these instructions at home: Medicines  Take  over-the-counter and prescription medicines only as told by your health care provider.  Do not take the following medicines unless your health care provider approves: ? NSAIDs, such as ibuprofen or naproxen. ? Vitamin supplements that contain vitamin A, vitamin E, or both. ? Hormone replacement therapy that contains estrogen with or without progestin. Eating and drinking   Eat a heart-healthy diet. This includes plenty of fresh fruits and vegetables, whole grains, low-fat (lean) protein, and low-fat dairy products.  Follow instructions from your health care provider about eating or drinking restrictions. Activity  Follow an exercise program approved by your health care provider.  Consider joining a cardiac rehabilitation program.  Take a break when you feel fatigued. Plan rest periods in your daily activities. Lifestyle   Do not use any products that contain nicotine or tobacco, such as cigarettes, e-cigarettes, and chewing tobacco. If you need help quitting, ask your health care provider.  If your health care provider says you can drink alcohol: ? Limit how much you use to:  0-1 drink a day for nonpregnant women.  0-2 drinks a day for men. ? Be aware of how much alcohol is in your drink. In the [place] one drink equals one 12 oz bottle of beer (355 mL), one 5 oz glass of wine (148 mL), or one 1 oz glass of hard liquor (44 mL). General instructions  Maintain a healthy weight.  Learn to manage stress.  Keep your vaccinations up to date. Get the flu (influenza) vaccine every year.  Talk to your health care provider if you feel depressed. Take a depression screening test to see if you are at risk for depression.  Work with your health care provider to manage other health conditions, such as hypertension or diabetes.  Keep all follow-up visits as told by your health care provider. This is important. Get help right away if:  You have pain in your chest, neck, arm, jaw, or  back, and the pain: ? Lasts more than a few minutes. ? Is recurring. ? Is not relieved by taking medicines under the tongue (sublingual nitroglycerin). ?  Increases in intensity or frequency.  You have a lot of sweating without cause.  You have unexplained: ? Heartburn or indigestion. ? Shortness of breath or difficulty breathing. ? Nausea or vomiting. ? Fatigue. ? Feelings of nervousness or anxiety. ? Weakness.  You have sudden light-headedness or dizziness.  You faint. These symptoms may represent a serious problem that is an emergency. Do not wait to see if the symptoms will go away. Get medical help right away. Call your local emergency services (911 in the [place] Do not drive yourself to the hospital. Summary  Angina is extreme discomfort in the chest, neck, arm, jaw, or back that is caused by a lack of blood in the heart wall.  There are many symptoms of angina. They include chest pain, unexplained heartburn or indigestion, sudden cold sweats, and fatigue.  Angina may be treated with behavioral changes, medicine, or surgery.  Symptoms of angina may represent an emergency. Get medical help right away. Call your local emergency services (911 in the [place] Do not drive yourself to the hospital. This information is not intended to replace advice given to you by your health care provider. Make sure you discuss any questions you have with your health care provider. Document Revised: 03/25/2018 Document Reviewed: 03/25/2018 Elsevier Patient Education  2020 [employer].

## 2020-07-08 NOTE — Progress Notes (Signed)
Preliminary review of TTE shows normal LV function with LVEF approximately 60-65%, no WMA, normal RV size and systolic function, normal RAP, no significant valvular abnormalities.  Formal report to follow.  Laurance Flatten, MD

## 2020-07-08 NOTE — Progress Notes (Signed)
Family Medicine Teaching Service Daily Progress Note Intern Pager: [phone]  Patient name: Gregory Foster Medical record number: [id number] Date of birth: [birth year] Age: 39 y.o. Gender: male  Primary Care Provider: Patient, No Pcp Per Consultants: Cardiology Code Status: Full  Pt Overview and Major Events to Date:  Admission 07/07/2020  Assessment and Plan: Gregory Foster is a 39 y.o. male presenting with chest pain. Patient has no known PMHx.  Exertional Chest Pain: Acute No acute events overnight.  Cardiology consulted and recommends cardiac CTA (to risk stratify), Lopressor 25 mg daily.  The panel and hemoglobin A1c unremarkable. -Vitals per routine -OOB with assistance -PT eval  Elevated blood pressure w/o diagnosis of hypertension Cardiologist suspects underlying untreated hypertension due to patient avoiding help with this.  Plan to start Lopressor 25 mg daily.  BP since admission (118-166)/(62-101), last 118/84 with pulse 66. -Continue to monitor  Headache No headache at this time.  We will continue to monitor.  Recent tooth removal:  Reports two teeth extractions on this Monday, 11/15, and subsequently initiated on 5 day course of amoxicillin. No current gum irritation or pain.  --Cont amoxicillin BID (11/15-11/19)   FEN/GI: Heart healthy diet Prophylaxis: Lovenox  Disposition: med-tele  Status is: Observation  The patient remains OBS appropriate and will d/c before 2 midnights.  Dispo: The patient is from: Home              Anticipated d/c is to: Home              Anticipated d/c date is: 1 day              Patient currently is medically stable to d/c.    Subjective:  Gregory Foster was found in the recliner this morning watching TV.  He had no complaints and reported feeling just fine at the moment.  He says that he slept "okay" overnight, no specific complaints besides at not being home.  Denies any chest pain, shortness of breath, weakness, dizziness  overnight or at the moment.  Says that he only gets these episodes of chest pain with weakness upon exertion.  Has not had them yet in the hospital, "but only because they have only been up to the chair and to the bathroom".  Overall, feels well, and reports that he is ready to go home soon as he is safe to do so.  Objective: Temp:  [97.8 F (36.6 C)-98.5 F (36.9 C)] 98 F (36.7 C) (11/18 1240) Pulse Rate:  [54-67] 66 (11/18 1240) Resp:  [12-20] 16 (11/18 1240) BP: (118-166)/(62-101) 118/84 (11/18 1240) SpO2:  [92 %-98 %] 94 % (11/18 1240) Weight:  [129.5 kg] 129.5 kg (11/18 0106)   Physical Exam General: Awake, alert, oriented Cardiovascular: Regular rate and rhythm, S1 and S2 present, no murmurs auscultated Respiratory: Lung fields clear to auscultation bilaterally Extremities: No bilateral lower extremity edema, palpable pedal and pretibial pulses bilaterally Neuro: Cranial nerves II through X grossly intact, able to move all extremities spontaneously    Laboratory: Recent Labs  Lab 07/07/20 1627  WBC 8.0  HGB 13.9  HCT 42.3  PLT 323   Recent Labs  Lab 07/07/20 1627 07/08/20 0233  NA 136 140  K 3.8 4.0  CL 103 104  CO2 27 27  BUN 12 13  CREATININE 1.00 1.02  CALCIUM 9.4 9.5  GLUCOSE 93 106*     Imaging/Diagnostic Tests:  CXR 2 view 07/07/2020 FINDINGS: The heart size and mediastinal contours are within normal limits.  Both lungs are clear. The visualized skeletal structures are unremarkable. IMPRESSION: No active cardiopulmonary disease.  Fayette Pho, MD 07/08/2020, 2:48 PM PGY-1, [facility] Family Medicine FPTS Intern pager: [phone], text pages welcome

## 2020-07-08 NOTE — Progress Notes (Signed)
  Echocardiogram 2D Echocardiogram has been performed.  Gregory Foster 07/08/2020, 2:07 PM

## 2020-07-08 NOTE — Care Management (Signed)
07-08-20 1556 Case Manager spoke with patient and wife is at the bedside. Patient is without insurance and a primary care provider at this time. Patient is agreeable for Case Manager to schedule an appointment with the [facility]. Appointment placed on the AVS. If patient is initiated on any new medications, he will benefit from Rx's going to the [facility] and delivered to the bedside prior to transition home. Gala Lewandowsky, RN,BSN Case Manager

## 2020-07-08 NOTE — Discharge Summary (Addendum)
[facility] Discharge Summary Patient left [facility] 07/09/2020  Patient name: Gregory Foster Medical record number: [id number] Date of birth: 1981/03/27 Age: 39 y.o. Gender: male Date of Admission: 07/07/2020  Date of AMA: 07/09/2020 Admitting Physician: Alcus Dad, MD  Primary Care Provider: Patient, No Pcp Per Consultants: Cardiology   Indication for Hospitalization: Chest pain with exertion   Discharge Diagnoses/Problem List:  Typical angina  AMA Disposition: Home  AMA Condition: Stable  Exam morning prior to leaving AMA:   General: Awake, alert, oriented Cardiovascular: Regular rate and rhythm, S1 and S2 present, no murmurs auscultated Respiratory: Lung fields clear to auscultation bilaterally Extremities: No bilateral lower extremity edema, palpable pedal and pretibial pulses bilaterally Neuro: Cranial nerves II through X grossly intact, able to move all extremities spontaneously    Brief Hospital Course:  Gregory Foster is a 39 y.o. male presenting with chest pain. Patient has no known PMHx but also has not visited a PCP in quite some time.    Exertional Chest Pain: Acute.  Patient presents with 2 weeks of worsening exertional substernal chest pressure. He also endorses SOB and decreased activity tolerance, resolves at rest. On presentation patient is hypertensive with BP 155/101, but all other vitals wnl. He has little to no chest pain currently. Physical exam unremarkable, EKG without ischemic changes, CXR normal, and high sensitivity troponin negative x2 (3>3). He was given ASA 355m at urgent care, and nitro was ordered in the ED. His presentation is most concerning for anginal pain/CAD (most likely). Heart score of 2, patient is overall low risk for cardiac etiology (no family or personal hx, nonsmoker, no known diabetes or HLD). However, differential also includes MI (less likely given negative troponin x2 and EKG wnl), 2/2 uncontrolled  hypertension, musculoskeletal chest pain, PE (Wells score 0), CHF (however no additonal stigmata suggestive of this), GERD, and pneumonia (doubt, afebrile, not seen on CXR).  Patient was admitted to cardiac telemetry floor.  Cardiology was consulted, performed echo and cardiac CTA.  Echo demonstrated EF 60 to 65%, no regional wall motion normalities of left ventricle, normal LV function, normal RV function, normal valve structure, normal IVC with appropriate respiratory variability. Cardiac CTA showed no calcium in arteries (calcium score 0), right dominant coronary artery structure with no anomalies, CAD rads0. Also showed all vessels are normal. Patient required no oxygen while admitted.  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  Cardiology recommended starting losartan 20 mg daily, prescription sent to pharmacy.  Patient should follow up with cardiology outpatient.    Elevated blood pressure w/o diagnosis of hypertension Patient has no diagnosed hx of HTN but has been checking his BP at home over the past 1-2 weeks where it has been elevated up to 180/129.  Blood pressure during admission ranged (118-166)/(62-92) with pulse 54-67.  Elevated BPs mostly 140s / 70-80s.  Cardiology recommended starting losartan 5 mg daily as mentioned above.    AMA Patient left AMA the afternoon of Friday 11/19. Friday morning during prerounds, resident met patient's wife for the first time at bedside. She relayed dissatisfaction with the work-up thus far and not yet having the chance to reproduce the chest pain with exertion. She was adamant about "something being wrong" and that perhaps we need to expand our search.  We discussed the good news that there is no evidence of acute myocardial infarction, structural issues, coronary artery calcification, or altered heart function.  I assured them  that the attending would visit after rounds to discuss the work-up thus  far and potential further work-up.  Later around lunch, attending visited patient and his wife to discuss everything.  Attending also ambulated patient in hallway and measured blood pressure and pulse ox after, was unable to reproduce anginal chest pain.  Patient and his wife were calm and agreeable at the time, did not voice any additional complaints.  About an hour later, the nurse called the attending back to the room because the patient and his wife had become upset.  Upon entering the room, the attending found the patient and his wife with elevated voices; the patient made a vague threat toward the attending.  Patient and wife then proceeded to collect their belongings and walk out without signing [name] paperwork.    Issues for Follow Up:  Started losartan before discharge, will need Cr check in one week. Cardiology to coordinate in context of newly establishing with PCP in December. Cardiology outpatient follow up  Establish care with PCP HTN Mild hyperlipidemia  Significant Procedures: ECHO, CT coronary morph w CTA cor 07/08/2020. Results below.   Significant Labs and Imaging:  Recent Labs  Lab 07/07/20 1627  WBC 8.0  HGB 13.9  HCT 42.3  PLT 323   Recent Labs  Lab 07/07/20 1627 07/08/20 0233  NA 136 140  K 3.8 4.0  CL 103 104  CO2 27 27  GLUCOSE 93 106*  BUN 12 13  CREATININE 1.00 1.02  CALCIUM 9.4 9.5  MG  --  2.2    CXR 2 view 07/07/2020 FINDINGS: The heart size and mediastinal contours are within normal limits. Both lungs are clear. The visualized skeletal structures are unremarkable. IMPRESSION: No active cardiopulmonary disease  CT coronary morph w CTA cor 07/08/2020 FINDINGS: Non-cardiac: See separate report from [facility]. No significant findings on limited lung and soft tissue windows. Calcium score: No calcium noted Coronary Arteries: Right dominant with no anomalies LM: Normal LAD: Normal D1: Normal D2: Normal Circumflex: Normal OM1:  Normal OM2: Normal RCA: Normal PDA: Normal PLA: Normal   IMPRESSION: 1. Calcium score 0 2.  Normal right dominant coronary arteries CAD RADS 0 3.  Normal aortic root 3.1 cm   Results/Tests Pending at Time of Discharge: None  Discharge Medications:  Allergies as of 07/09/2020   No Known Allergies      Medication List     TAKE these medications    amoxicillin 875 MG tablet Commonly known as: AMOXIL Take 875 mg by mouth 2 (two) times daily.   ibuprofen 600 MG tablet Commonly known as: ADVIL Take 600 mg by mouth every 6 (six) hours as needed for pain.   losartan 25 MG tablet Commonly known as: COZAAR Take 1 tablet (25 mg total) by mouth daily.        Discharge Instructions: Please refer to Patient Instructions section of EMR for full details.  Patient was counseled important signs and symptoms that should prompt return to medical care, changes in medications, dietary instructions, activity restrictions, and follow up appointments.   Follow-Up Appointments:  Follow-up Information     Argentina Donovan, PA-C Follow up on 08/04/2020.   Specialty: Family Medicine Why: @ 1:50 for hospital follow up appointment. If you cannot make this scheduled appointment please feel free to call the office to reschedule. Onsite pharmacy and medications range from $4.00-10.00.  Contact information: [address] [place] [address] [phone]         Johnsie Cancel,  Wallis Bamberg, MD. Schedule an appointment as soon as possible for a visit in 4 week(s).   Specialty: Cardiology Why: Call to (a) coordinate creatinine lab test and (b) make an appointment. Make an outpatient cardiology follow up appointment with the [facility] cardiology office.  Contact information: [address] [address] [phone]                 Ezequiel Essex, MD 07/11/2020, 7:08 PM PGY-1, [place] Upper-Level Resident Addendum   I  have independently interviewed and examined the patient. I have discussed the above with the original author and agree with their documentation. Please see also any attending notes.   Gifford Shave, MD PGY-2, [facility] 07/12/2020 7:16 AM  FPTS Service pager: [phone] (text pages welcome through [facility])

## 2020-07-08 NOTE — Plan of Care (Signed)
?  Problem: Clinical Measurements: ?Goal: Will remain free from infection ?Outcome: Progressing ?  ?

## 2020-07-09 ENCOUNTER — Other Ambulatory Visit (HOSPITAL_COMMUNITY): Payer: Self-pay | Admitting: Family Medicine

## 2020-07-09 DIAGNOSIS — R0600 Dyspnea, unspecified: Secondary | ICD-10-CM

## 2020-07-09 MED ORDER — LOSARTAN POTASSIUM 25 MG PO TABS
25.0000 mg | ORAL_TABLET | Freq: Every day | ORAL | 0 refills | Status: DC
Start: 2020-07-09 — End: 2020-07-09

## 2020-07-09 MED FILL — LOSARTAN POTASSIUM 25 MG TA: 25 | 30 days supply | Qty: 30 | Fill #0

## 2020-07-09 NOTE — Progress Notes (Signed)
I was called to the patients room by staff RN. Patient and wife at bedside and very upset that they feel they are being discharged without any diagnosis and they both were concerned that nothing was being done. They said that they wanted to speak with someone who was going to address this issue of his chest pain and shortness of breathe. The patient and his wife both stated that patient went to the bathroom less than an hr ago and had more chest pain and shortness of breath. They both stated they were concerned. I paged the MD. Dr. Deirdre Priest came to room. I noted that previously in the day, Dr. Deirdre Priest was in the room for about 20 minutes with the patient. Dr. Deirdre Priest quickly came to room. He was here within 5 minutes of being notified. Once he walked in the room, the patient and his wife began to get louder and more combative. Dr. Deirdre Priest attempted to begin to explain plan of care. Patient then stated "if someone doesn't remove this man from my room, something bad is going to happen." Patient and wife stormed out of room and left before anything could further be discussed.

## 2020-07-09 NOTE — Progress Notes (Signed)
Pt was upset about being in the hospital and paying bills unnecessary. He stated that he does not have an insurance. Pt stated to the Charge RN and RN that he is  disappointed about not getting care he expected as he still had SOB while moving around. Dr. Deirdre Priest came to talk to the pt but pt showed no willingness to talk to the MD and asked the RN to call the security. When RN did not do so pt left with his wife.

## 2020-07-09 NOTE — Progress Notes (Signed)
Subjective:  Denies SSCP, palpitations or Dyspnea   Objective:  Vitals:   07/08/20 2037 07/09/20 0030 07/09/20 0420 07/09/20 0736  BP: 115/63 (!) 105/59 116/71 (!) 131/92  Pulse: 68 (!) 54 (!) 50 81  Resp: 17 17 17 18   Temp: 98.5 F (36.9 C) 98.6 F (37 C) 98.1 F (36.7 C) 98.1 F (36.7 C)  TempSrc: Oral Oral Oral Oral  SpO2: 93% 95%  94%  Weight:   127.6 kg   Height:        Intake/Output from previous day:  Intake/Output Summary (Last 24 hours) at 07/09/2020 07/11/2020 Last data filed at 07/08/2020 1438 Gross per 24 hour  Intake 40 ml  Output --  Net 40 ml    Physical Exam: Affect appropriate Healthy:  appears stated age HEENT: normal Neck supple with no adenopathy JVP normal no bruits no thyromegaly Lungs clear with no wheezing and good diaphragmatic motion Heart:  S1/S2 no murmur, no rub, gallop or click PMI normal Abdomen: benighn, BS positve, no tenderness, no AAA no bruit.  No HSM or HJR Distal pulses intact with no bruits No edema Neuro non-focal Skin warm and dry No muscular weakness   Lab Results: Basic Metabolic Panel: Recent Labs    07/07/20 1627 07/08/20 0233  NA 136 140  K 3.8 4.0  CL 103 104  CO2 27 27  GLUCOSE 93 106*  BUN 12 13  CREATININE 1.00 1.02  CALCIUM 9.4 9.5  MG  --  2.2   Liver Function Tests: No results for input(s): AST, ALT, ALKPHOS, BILITOT, PROT, ALBUMIN in the last 72 hours. No results for input(s): LIPASE, AMYLASE in the last 72 hours. CBC: Recent Labs    07/07/20 1627  WBC 8.0  HGB 13.9  HCT 42.3  MCV 86.5  PLT 323   Cardiac Enzymes: No results for input(s): CKTOTAL, CKMB, CKMBINDEX, TROPONINI in the last 72 hours. BNP: Invalid input(s): POCBNP D-Dimer: No results for input(s): DDIMER in the last 72 hours. Hemoglobin A1C: Recent Labs    07/08/20 0233  HGBA1C 5.6   Fasting Lipid Panel: Recent Labs    07/08/20 0233  CHOL 165  HDL 24*  LDLCALC 114*  TRIG 135  CHOLHDL 6.9    Imaging: DG  Chest 2 View  Result Date: 07/07/2020 CLINICAL DATA:  Chest pain EXAM: CHEST - 2 VIEW COMPARISON:  None. FINDINGS: The heart size and mediastinal contours are within normal limits. Both lungs are clear. The visualized skeletal structures are unremarkable. IMPRESSION: No active cardiopulmonary disease. Electronically Signed   By: 07/09/2020 M.D.   On: 07/07/2020 17:37   CT CORONARY MORPH W/CTA COR W/SCORE W/CA W/CM &/OR WO/CM  Addendum Date: 07/08/2020   ADDENDUM REPORT: 07/08/2020 13:17 CLINICAL DATA:  Chest pain EXAM: Cardiac CTA MEDICATIONS: Sub lingual nitro. 4 mg and lopressor 25mg  TECHNIQUE: The patient was scanned on a 07/10/2020 192 scanner. Gantry rotation speed was 250 msecs. Collimation was. 6 mm . A 120 kV prospective scan was triggered in the ascending thoracic aorta at 140 HU's with full mA between 30-70% of the R-R interval . Average HR during the scan was 61 bpm. The 3D data set was interpreted on a dedicated work station using MPR, MIP and VRT modes. A total of 80 cc of contrast was used. FINDINGS: Non-cardiac: See separate report from [facility]. No significant findings on limited lung and soft tissue windows. Calcium score: No calcium noted Coronary Arteries: Right dominant with no anomalies LM:  Normal LAD: Normal D1: Normal D2: Normal Circumflex: Normal OM1: Normal OM2: Normal RCA: Normal PDA: Normal PLA: Normal IMPRESSION: 1. Calcium score 0 2.  Normal right dominant coronary arteries CAD RADS 0 3.  Normal aortic root 3.1 cm Charlton Haws Electronically Signed   By: Charlton Haws M.D.   On: 07/08/2020 13:17   Result Date: 07/08/2020 EXAM: OVER-READ INTERPRETATION  CT CHEST The following report is an over-read performed by radiologist Dr. Donzetta Kohut of [facility] on 07/08/2020. This over-read does not include interpretation of cardiac or coronary anatomy or pathology. The coronary calcium score/coronary CTA interpretation by the cardiologist is attached.  COMPARISON:  CT of the abdomen from October 10, 2007 FINDINGS: Vascular: Please see dedicated coronary and cardiac evaluation for further detail. No pericardial effusion. Mediastinum/Nodes: Mediastinal structures with limited assessment, no signs of adenopathy. Esophagus, imaged portions grossly normal. Lungs/Pleura: Basilar atelectasis. No dense consolidation. No pleural effusion. Upper Abdomen: Incidental imaging of upper abdominal contents is unremarkable. Musculoskeletal: No chest wall mass or suspicious bone lesions identified. IMPRESSION: 1. Basilar atelectasis. 2. Please see dedicated coronary and cardiac evaluation for further detail. Electronically Signed: By: Donzetta Kohut M.D. On: 07/08/2020 12:27   ECHOCARDIOGRAM COMPLETE  Result Date: 07/08/2020    ECHOCARDIOGRAM REPORT   Patient Name:   Gregory Foster Date of Exam: 07/08/2020 Medical Rec #:  [id number]     Height:       71.0 in Accession #:    [id number]    Weight:       285.4 lb Date of Birth:  01-06-1981      BSA:          2.453 m Patient Age:    39 years      BP:           118/84 mmHg Patient Gender: M             HR:           60 bpm. Exam Location:  Inpatient Procedure: 2D Echo, Cardiac Doppler and Color Doppler Indications:    Dyspnea 786.09/ 06.00  History:        Patient has no prior history of Echocardiogram examinations.  Sonographer:    Elmarie Shiley Dance Referring Phys: [id number] Madisynn Plair C Dagan Heinz IMPRESSIONS  1. Left ventricular ejection fraction, by estimation, is 60 to 65%. The left ventricle has normal function. The left ventricle has no regional wall motion abnormalities. Left ventricular diastolic parameters were normal.  2. Right ventricular systolic function is normal. The right ventricular size is normal.  3. The mitral valve is normal in structure. No evidence of mitral valve regurgitation. No evidence of mitral stenosis.  4. The aortic valve is normal in structure. Aortic valve regurgitation is not visualized. No aortic stenosis is  present.  5. The inferior vena cava is normal in size with greater than 50% respiratory variability, suggesting right atrial pressure of 3 mmHg. FINDINGS  Left Ventricle: Left ventricular ejection fraction, by estimation, is 60 to 65%. The left ventricle has normal function. The left ventricle has no regional wall motion abnormalities. The left ventricular internal cavity size was normal in size. There is  no left ventricular hypertrophy. Left ventricular diastolic parameters were normal. Right Ventricle: The right ventricular size is normal. No increase in right ventricular wall thickness. Right ventricular systolic function is normal. Left Atrium: Left atrial size was normal in size. Right Atrium: Right atrial size was normal in size. Pericardium: There is no evidence of pericardial  effusion. Mitral Valve: The mitral valve is normal in structure. No evidence of mitral valve regurgitation. No evidence of mitral valve stenosis. Tricuspid Valve: The tricuspid valve is normal in structure. Tricuspid valve regurgitation is not demonstrated. No evidence of tricuspid stenosis. Aortic Valve: The aortic valve is normal in structure. Aortic valve regurgitation is not visualized. No aortic stenosis is present. Pulmonic Valve: The pulmonic valve was normal in structure. Pulmonic valve regurgitation is not visualized. No evidence of pulmonic stenosis. Aorta: The aortic root is normal in size and structure. Venous: The inferior vena cava is normal in size with greater than 50% respiratory variability, suggesting right atrial pressure of 3 mmHg. IAS/Shunts: No atrial level shunt detected by color flow Doppler.  LEFT VENTRICLE PLAX 2D LVIDd:         5.90 cm  Diastology LVIDs:         3.40 cm  LV e' medial:    7.18 cm/s LV PW:         0.90 cm  LV E/e' medial:  9.8 LV IVS:        1.10 cm  LV e' lateral:   13.10 cm/s LVOT diam:     2.20 cm  LV E/e' lateral: 5.4 LV SV:         82 LV SV Index:   33 LVOT Area:     3.80 cm  RIGHT  VENTRICLE             IVC RV Basal diam:  3.40 cm     IVC diam: 1.40 cm RV Mid diam:    2.50 cm RV S prime:     10.30 cm/s TAPSE (M-mode): 2.3 cm LEFT ATRIUM             Index       RIGHT ATRIUM           Index LA diam:        4.30 cm 1.75 cm/m  RA Area:     16.90 cm LA Vol (A2C):   76.4 ml 31.15 ml/m RA Volume:   46.40 ml  18.92 ml/m LA Vol (A4C):   42.2 ml 17.21 ml/m LA Biplane Vol: 60.6 ml 24.71 ml/m  AORTIC VALVE LVOT Vmax:   93.80 cm/s LVOT Vmean:  64.300 cm/s LVOT VTI:    0.216 m  AORTA Ao Root diam: 3.70 cm Ao Asc diam:  2.90 cm MITRAL VALVE MV Area (PHT): 3.77 cm    SHUNTS MV Decel Time: 201 msec    Systemic VTI:  0.22 m MV E velocity: 70.70 cm/s  Systemic Diam: 2.20 cm MV A velocity: 54.80 cm/s MV E/A ratio:  1.29 Charlton Haws MD Electronically signed by Charlton Haws MD Signature Date/Time: 07/08/2020/6:54:29 PM    Final     Cardiac Studies:  ECG: NSR normal    Telemetry: NSR no arrhythmia  Echo: Normal EF 60-65% no LVH or valve disease   Medications:   . amoxicillin  1,000 mg Oral BID  . enoxaparin (LOVENOX) injection  65 mg Subcutaneous Daily  . losartan  25 mg Oral Daily      Assessment/Plan:   1. Chest Pain:  Normal cardiac CTA calcium score 0 normal aorta and no chest pathology non cardiac 2. Dyspnea:  Normal exam and normal echo ? Functional 3. HTN:  Improved continue ARB  Ok to d/c home He needs a primary care physician I can see x 1 TOC as outpatient will arrange   Charlton Haws 07/09/2020, 9:38  AM    

## 2020-07-09 NOTE — Progress Notes (Signed)
Family Medicine Teaching Service Daily Progress Note Intern Pager: [phone]  Patient name: Gregory Foster Medical record number: [id number] Date of birth: 1981-07-25 Age: 39 y.o. Gender: male  Primary Care Provider: Patient, No Pcp Per Consultants: Cards Code Status: Full  Pt Overview and Major Events to Date:  Admission 07/07/2020  Assessment and Plan: Mubashir Mallek a 39 y.o.malepresenting with chest pain.Patient has no known PMHx.  ExertionalChest Pain 2/2 suspected typical angina No acute events overnight.  Cardiology ordered echo and CTA cardiac, both which were unremarkable. Cardiology recommends starting Lopressor 25 mg daily.  -Because he is starting Lopressor will need creatinine check within a week or so, cardiology to coordinate -Has December appointment established with PCP -Vitals per routine -OOB with assistance -PT eval  Elevated blood pressure w/o diagnosis of hypertension Cardiologist suspects underlying untreated hypertension due to patient avoiding help with this.  Plan to start Lopressor 25 mg daily.    BP since admission (105-135)/(59-92), last 131/22 with pulse 81. -Continue to monitor  Recent tooth removal:  Reports two teeth extractions on this Monday, 11/15, and subsequently initiated on 5 day course of amoxicillin. No current gum irritation or pain.  --Cont amoxicillin BID (11/15-11/19)  Postprandial epigastric discomfort First reported hospital day #2.  Patient reports getting epigastric discomfort (but clarified not pain), "the shakes", and also "feeling really weird" after he eats.  No chest pain, shortness of breath, epigastric burning, diarrhea, constipation associated.  No nausea or vomiting.  No epigastric fullness or abdominal bloating.  No red flag symptoms at this time, appropriate for outpatient work-up.  Urinary symptoms On hospital day #2, patient reports progressive history of frequent urination and the feeling "that I cannot empty  all the way" despite minimal fluid intake.  Reports dark urine.  No hematuria, dysuria, flank pain, fever or chills.  No red flag symptoms at this time, appropriate for outpatient work-up.  FEN/GI:Heart healthy diet Prophylaxis:Lovenox   Status is: Observation  The patient remains OBS appropriate and will d/c before 2 midnights.  Dispo: The patient is from: Home              Anticipated d/c is to: Home              Anticipated d/c date is: 1 day              Patient currently is medically stable to d/c.   Subjective:  Patient resting comfortably in bed at the bedside.  No acute complaints overnight.  Reports that he slept okay, no recurrence of the chest pain or shortness of breath that he was admitted with.  I discussed the results of his echo and CTA coronary came back completely normal.  Stressed this is good news that we have not found anything acutely wrong and no indications of MI or structural issues.  Relayed that cardiology like to start Lopressor 25 mg daily but that he will need to get his creatinine checked within a week or 2; instructed him that cardiology would coordinate this creatinine check.  After discussing this with patient and his wife, wife expressed that she was not satisfied thus far and felt as though something was wrong that we had not found yet.  She was uncomfortable with him going home today before we were able to reproduce his chest pain.  She expressed that she would feel much more comfortable if we were able to ambulate him in the hall to try and reproduce the chest pain while also checking his  blood pressure and pulse ox.  She is very concerned because she reports that the patient does not "complain much" so that when he complains of his chest pain, the pain is much worse than he is letting on.  She and the patient both expressed concern that may be it is not the heart producing this chest pain but may be the lungs or something else.  They wonder if we need to  "look somewhere else".  They both also expressed concern about the acute onset of this chest pain.  Patient said that "overnight I went from being able to carry 50 pound bags of feed, 1 on each shoulder, to the next day barely able to lift 5 pound gas can."  During this discussion, patient also discloses postprandial epigastric discomfort and urinary frequency that was not noted before.  For the postprandial epigastric discomfort, patient reports getting epigastric discomfort (but clarified not pain), "the shakes", and also "feeling really weird" after he eats.  No chest pain, shortness of breath, epigastric burning, diarrhea, constipation associated.  No nausea or vomiting.  No epigastric fullness or abdominal bloating.  For the urinary frequency, patient reports progressive history of frequent urination and the feeling "that I cannot empty all the way" despite minimal fluid intake.  Reports dark urine.  No hematuria, dysuria, flank pain, fever or chills.  Because of their concerns, I told him I would bring this to the team at our 10 AM rounds and we would decide where to go from there.  I also told him Dr. Deirdre Priest to be by in person later on to discuss their concerns about the work-up thus far and ability to go home.  Objective: Temp:  [98 F (36.7 C)-98.6 F (37 C)] 98.1 F (36.7 C) (11/19 0736) Pulse Rate:  [50-81] 81 (11/19 0736) Resp:  [16-18] 18 (11/19 0736) BP: (105-135)/(59-92) 131/92 (11/19 0736) SpO2:  [93 %-95 %] 94 % (11/19 0736) Weight:  [127.6 kg] 127.6 kg (11/19 0420) Physical Exam: General: Well-developed male, alert and oriented, no acute distress Cardiovascular: Regular rate and rhythm, S1 and S2 auscultated, no murmurs appreciated Respiratory: Clear to auscultation bilaterally Abdomen: Soft, nondistended Extremities: No BLE edema, warm skin, palpable pretibial pulses  Laboratory: Recent Labs  Lab 07/07/20 1627  WBC 8.0  HGB 13.9  HCT 42.3  PLT 323   Recent Labs   Lab 07/07/20 1627 07/08/20 0233  NA 136 140  K 3.8 4.0  CL 103 104  CO2 27 27  BUN 12 13  CREATININE 1.00 1.02  CALCIUM 9.4 9.5  GLUCOSE 93 106*    Imaging/Diagnostic Tests:  CTA coronary 07/08/2020 FINDINGS: Non-cardiac: See separate report from [facility]. No significant findings on limited lung and soft tissue windows.  Calcium score: No calcium noted  Coronary Arteries: Right dominant with no anomalies  LM: Normal  LAD: Normal  D1: Normal  D2: Normal  Circumflex: Normal  OM1: Normal  OM2: Normal  RCA: Normal  PDA: Normal  PLA: Normal  IMPRESSION: 1. Calcium score 0  2.  Normal right dominant coronary arteries CAD RADS 0  3.  Normal aortic root 3.1 cm   Echo complete 07/08/2020 IMPRESSIONS  1. Left ventricular ejection fraction, by estimation, is 60 to 65%. The  left ventricle has normal function. The left ventricle has no regional  wall motion abnormalities. Left ventricular diastolic parameters were  normal.  2. Right ventricular systolic function is normal. The right ventricular  size is normal.  3. The mitral  valve is normal in structure. No evidence of mitral valve  regurgitation. No evidence of mitral stenosis.  4. The aortic valve is normal in structure. Aortic valve regurgitation is  not visualized. No aortic stenosis is present.  5. The inferior vena cava is normal in size with greater than 50%  respiratory variability, suggesting right atrial pressure of 3 mmHg.   Fayette Pho, MD 07/09/2020, 12:26 PM PGY-1, [facility] Family Medicine FPTS Intern pager: [phone], text pages welcome

## 2020-08-04 ENCOUNTER — Inpatient Hospital Stay: Payer: Self-pay | Admitting: Physician Assistant

## 2020-08-09 ENCOUNTER — Ambulatory Visit: Payer: Self-pay | Admitting: Physician Assistant

## 2021-05-25 ENCOUNTER — Other Ambulatory Visit: Payer: Self-pay | Admitting: Podiatry

## 2021-05-26 ENCOUNTER — Encounter
Admission: RE | Admit: 2021-05-26 | Discharge: 2021-05-26 | Disposition: A | Discharge status: Home / Self Care | Payer: Self-pay | Source: Ambulatory Visit | Attending: Podiatry | Admitting: Podiatry

## 2021-05-26 ENCOUNTER — Other Ambulatory Visit: Payer: Self-pay

## 2021-05-27 ENCOUNTER — Ambulatory Visit: Payer: Self-pay

## 2021-05-27 ENCOUNTER — Encounter: Payer: Self-pay | Admitting: Podiatry

## 2021-05-27 ENCOUNTER — Ambulatory Visit: Payer: Self-pay | Admitting: Anesthesiology

## 2021-05-27 ENCOUNTER — Ambulatory Visit
Admission: RE | Disposition: A | Discharge status: Home / Self Care | Payer: Self-pay | Source: Home / Self Care | Attending: Podiatry

## 2021-05-27 ENCOUNTER — Ambulatory Visit
Admission: RE | Admit: 2021-05-27 | Discharge: 2021-05-27 | Disposition: A | Discharge status: Home / Self Care | Payer: Self-pay | Attending: Podiatry | Admitting: Podiatry

## 2021-05-27 ENCOUNTER — Other Ambulatory Visit: Payer: Self-pay

## 2021-05-27 DIAGNOSIS — S93431A Sprain of tibiofibular ligament of right ankle, initial encounter: Secondary | ICD-10-CM | POA: Insufficient documentation

## 2021-05-27 DIAGNOSIS — Z419 Encounter for procedure for purposes other than remedying health state, unspecified: Secondary | ICD-10-CM

## 2021-05-27 DIAGNOSIS — Z09 Encounter for follow-up examination after completed treatment for conditions other than malignant neoplasm: Secondary | ICD-10-CM

## 2021-05-27 DIAGNOSIS — S82841A Displaced bimalleolar fracture of right lower leg, initial encounter for closed fracture: Secondary | ICD-10-CM | POA: Insufficient documentation

## 2021-05-27 DIAGNOSIS — G8918 Other acute postprocedural pain: Secondary | ICD-10-CM

## 2021-05-27 DIAGNOSIS — Z87891 Personal history of nicotine dependence: Secondary | ICD-10-CM | POA: Insufficient documentation

## 2021-05-27 DIAGNOSIS — X58XXXA Exposure to other specified factors, initial encounter: Secondary | ICD-10-CM | POA: Insufficient documentation

## 2021-05-27 HISTORY — PX: ORIF ANKLE FRACTURE: SHX5408

## 2021-05-27 SURGERY — OPEN REDUCTION INTERNAL FIXATION (ORIF) ANKLE FRACTURE
Anesthesia: General | Site: Ankle | Laterality: Right

## 2021-05-27 MED ORDER — OXYCODONE HCL 5 MG PO TABS: 5.0000 mg | ORAL_TABLET | Freq: Once | ORAL | Status: DC | PRN

## 2021-05-27 MED ORDER — PHENYLEPHRINE HCL (PRESSORS) 10 MG/ML IV SOLN
INTRAVENOUS | Status: AC
  Filled 2021-05-27: qty 1

## 2021-05-27 MED ORDER — FENTANYL CITRATE (PF) 100 MCG/2ML IJ SOLN
INTRAMUSCULAR | Status: DC | PRN
  Administered 2021-05-27: 25 ug via INTRAVENOUS
  Administered 2021-05-27 (×3): 50 ug via INTRAVENOUS
  Administered 2021-05-27: 25 ug via INTRAVENOUS
  Administered 2021-05-27: 50 ug via INTRAVENOUS

## 2021-05-27 MED ORDER — POVIDONE-IODINE 7.5 % EX SOLN
Freq: Once | CUTANEOUS | Status: DC
  Filled 2021-05-27: qty 118

## 2021-05-27 MED ORDER — ROPIVACAINE HCL 5 MG/ML IJ SOLN
INTRAMUSCULAR | Status: AC
  Filled 2021-05-27: qty 30

## 2021-05-27 MED ORDER — MIDAZOLAM HCL 2 MG/2ML IJ SOLN
INTRAMUSCULAR | Status: DC | PRN
  Administered 2021-05-27: 1 mg via INTRAVENOUS

## 2021-05-27 MED ORDER — FAMOTIDINE 20 MG PO TABS
ORAL_TABLET | ORAL | Status: AC
  Administered 2021-05-27: 20 mg via ORAL
  Filled 2021-05-27: qty 1

## 2021-05-27 MED ORDER — MIDAZOLAM HCL 2 MG/2ML IJ SOLN
INTRAMUSCULAR | Status: AC
  Filled 2021-05-27: qty 2

## 2021-05-27 MED ORDER — OXYCODONE HCL 5 MG/5ML PO SOLN
5.0000 mg | Freq: Once | ORAL | Status: DC | PRN
Start: 2021-05-27 — End: 2021-05-28

## 2021-05-27 MED ORDER — ROPIVACAINE HCL-NACL 0.25-0.9 % IJ SOLN
INTRAMUSCULAR | Status: DC | PRN
  Administered 2021-05-27 (×2): 30 mL via PERINEURAL

## 2021-05-27 MED ORDER — FENTANYL CITRATE PF 50 MCG/ML IJ SOSY: 50.0000 ug | PREFILLED_SYRINGE | Freq: Once | INTRAMUSCULAR | Status: AC

## 2021-05-27 MED ORDER — PROPOFOL 10 MG/ML IV BOLUS
INTRAVENOUS | Status: AC
  Filled 2021-05-27: qty 40

## 2021-05-27 MED ORDER — OXYCODONE-ACETAMINOPHEN 7.5-325 MG PO TABS: 1.0000 | ORAL_TABLET | Freq: Four times a day (QID) | ORAL | 0 refills | Status: AC | PRN

## 2021-05-27 MED ORDER — AMOXICILLIN-POT CLAVULANATE 875-125 MG PO TABS: 1.0000 | ORAL_TABLET | Freq: Two times a day (BID) | ORAL | 0 refills | Status: AC

## 2021-05-27 MED ORDER — SODIUM CHLORIDE FLUSH 0.9 % IV SOLN
INTRAVENOUS | Status: AC
  Filled 2021-05-27: qty 20

## 2021-05-27 MED ORDER — MIDAZOLAM HCL 2 MG/2ML IJ SOLN
INTRAMUSCULAR | Status: AC
  Administered 2021-05-27: 1 mg via INTRAVENOUS
  Filled 2021-05-27: qty 2

## 2021-05-27 MED ORDER — SODIUM CHLORIDE 0.9 % IV SOLN
INTRAVENOUS | Status: DC | PRN
  Administered 2021-05-27: 45 ug/min via INTRAVENOUS

## 2021-05-27 MED ORDER — FENTANYL CITRATE PF 50 MCG/ML IJ SOSY
PREFILLED_SYRINGE | INTRAMUSCULAR | Status: AC
  Administered 2021-05-27: 50 ug via INTRAVENOUS
  Filled 2021-05-27: qty 1

## 2021-05-27 MED ORDER — GLYCOPYRROLATE 0.2 MG/ML IJ SOLN
INTRAMUSCULAR | Status: DC | PRN
  Administered 2021-05-27: .2 mg via INTRAVENOUS

## 2021-05-27 MED ORDER — FENTANYL CITRATE (PF) 100 MCG/2ML IJ SOLN
INTRAMUSCULAR | Status: AC
  Filled 2021-05-27: qty 2

## 2021-05-27 MED ORDER — ACETAMINOPHEN 10 MG/ML IV SOLN
INTRAVENOUS | Status: AC
  Filled 2021-05-27: qty 100

## 2021-05-27 MED ORDER — LACTATED RINGERS IV SOLN: INTRAVENOUS | Status: DC

## 2021-05-27 MED ORDER — MIDAZOLAM HCL 2 MG/2ML IJ SOLN
1.0000 mg | Freq: Once | INTRAMUSCULAR | Status: AC
  Administered 2021-05-27: 1 mg via INTRAVENOUS

## 2021-05-27 MED ORDER — PHENYLEPHRINE HCL (PRESSORS) 10 MG/ML IV SOLN
INTRAVENOUS | Status: DC | PRN
  Administered 2021-05-27 (×5): 100 ug via INTRAVENOUS

## 2021-05-27 MED ORDER — ACETAMINOPHEN 10 MG/ML IV SOLN
INTRAVENOUS | Status: DC | PRN
  Administered 2021-05-27: 1000 mg via INTRAVENOUS

## 2021-05-27 MED ORDER — DEXAMETHASONE SODIUM PHOSPHATE 10 MG/ML IJ SOLN
INTRAMUSCULAR | Status: DC | PRN
  Administered 2021-05-27: 5 mg via INTRAVENOUS

## 2021-05-27 MED ORDER — LIDOCAINE HCL (CARDIAC) PF 100 MG/5ML IV SOSY
PREFILLED_SYRINGE | INTRAVENOUS | Status: DC | PRN
  Administered 2021-05-27: 100 mg via INTRAVENOUS

## 2021-05-27 MED ORDER — ORAL CARE MOUTH RINSE: 15.0000 mL | Freq: Once | OROMUCOSAL | Status: AC

## 2021-05-27 MED ORDER — CHLORHEXIDINE GLUCONATE 0.12 % MT SOLN
OROMUCOSAL | Status: AC
  Administered 2021-05-27: 15 mL via OROMUCOSAL
  Filled 2021-05-27: qty 15

## 2021-05-27 MED ORDER — CEFAZOLIN SODIUM-DEXTROSE 2-4 GM/100ML-% IV SOLN
2.0000 g | INTRAVENOUS | Status: AC
  Administered 2021-05-27: 2 g via INTRAVENOUS

## 2021-05-27 MED ORDER — FAMOTIDINE 20 MG PO TABS: 20.0000 mg | ORAL_TABLET | Freq: Once | ORAL | Status: AC

## 2021-05-27 MED ORDER — BUPIVACAINE HCL (PF) 0.5 % IJ SOLN
INTRAMUSCULAR | Status: AC
  Filled 2021-05-27: qty 30

## 2021-05-27 MED ORDER — ONDANSETRON HCL 4 MG PO TABS: 4.0000 mg | ORAL_TABLET | Freq: Three times a day (TID) | ORAL | 0 refills | Status: AC | PRN

## 2021-05-27 MED ORDER — ONDANSETRON HCL 4 MG/2ML IJ SOLN
INTRAMUSCULAR | Status: DC | PRN
  Administered 2021-05-27: 4 mg via INTRAVENOUS

## 2021-05-27 MED ORDER — CEFAZOLIN SODIUM-DEXTROSE 2-4 GM/100ML-% IV SOLN
INTRAVENOUS | Status: AC
  Filled 2021-05-27: qty 100

## 2021-05-27 MED ORDER — FENTANYL CITRATE (PF) 100 MCG/2ML IJ SOLN: 25.0000 ug | INTRAMUSCULAR | Status: DC | PRN

## 2021-05-27 MED ORDER — CHLORHEXIDINE GLUCONATE 0.12 % MT SOLN: 15.0000 mL | Freq: Once | OROMUCOSAL | Status: AC

## 2021-05-27 MED ORDER — LIDOCAINE HCL (PF) 1 % IJ SOLN
INTRAMUSCULAR | Status: AC
  Filled 2021-05-27: qty 5

## 2021-05-27 MED ORDER — ASPIRIN EC 81 MG PO TBEC: 81.0000 mg | DELAYED_RELEASE_TABLET | Freq: Two times a day (BID) | ORAL | 0 refills | Status: AC

## 2021-05-27 MED ORDER — LIDOCAINE HCL (PF) 1 % IJ SOLN
INTRAMUSCULAR | Status: DC | PRN
  Administered 2021-05-27 (×2): 1 mL via SUBCUTANEOUS

## 2021-05-27 MED ORDER — MIDAZOLAM HCL 2 MG/2ML IJ SOLN: 1.0000 mg | Freq: Once | INTRAMUSCULAR | Status: AC

## 2021-05-27 MED ORDER — BUPIVACAINE HCL (PF) 0.5 % IJ SOLN
INTRAMUSCULAR | Status: DC | PRN
  Administered 2021-05-27: 20 mL

## 2021-05-27 MED ORDER — PROPOFOL 10 MG/ML IV BOLUS
INTRAVENOUS | Status: DC | PRN
  Administered 2021-05-27: 200 mg via INTRAVENOUS

## 2021-05-27 MED ORDER — 0.9 % SODIUM CHLORIDE (POUR BTL) OPTIME
TOPICAL | Status: DC | PRN
  Administered 2021-05-27: 1000 mL

## 2021-05-27 SURGICAL SUPPLY — 64 items
BIT DRILL 2.0X130 SLD AO (BIT) ×1 IMPLANT
BIT DRILL CANN LONG 4.6X220 (DRILL) IMPLANT
BLADE SURG 15 STRL LF DISP TIS (BLADE) IMPLANT
BLADE SURG 15 STRL SS (BLADE)
BNDG COHESIVE 4X5 TAN ST LF (GAUZE/BANDAGES/DRESSINGS) ×1 IMPLANT
BNDG CONFORM 2 STRL LF (GAUZE/BANDAGES/DRESSINGS) ×1 IMPLANT
BNDG CONFORM 3 STRL LF (GAUZE/BANDAGES/DRESSINGS) ×1 IMPLANT
BNDG ELASTIC 4X5.8 VLCR NS LF (GAUZE/BANDAGES/DRESSINGS) ×3 IMPLANT
BNDG ELASTIC 4X5.8 VLCR STR LF (GAUZE/BANDAGES/DRESSINGS) IMPLANT
BNDG ELASTIC 6X5.8 VLCR STR LF (GAUZE/BANDAGES/DRESSINGS) ×1 IMPLANT
BNDG ESMARK 4X12 TAN STRL LF (GAUZE/BANDAGES/DRESSINGS) ×2 IMPLANT
BNDG GAUZE ELAST 4 BULKY (GAUZE/BANDAGES/DRESSINGS) ×2 IMPLANT
CUFF TOURN SGL QUICK 18X4 (TOURNIQUET CUFF) IMPLANT
CUFF TOURN SGL QUICK 24 (TOURNIQUET CUFF)
CUFF TRNQT CYL 24X4X16.5-23 (TOURNIQUET CUFF) IMPLANT
DRAPE C-ARM XRAY 36X54 (DRAPES) ×2 IMPLANT
DRAPE C-ARMOR (DRAPES) ×2 IMPLANT
DRILL CANN LONG 4.6X220 (DRILL) ×2
DURAPREP 26ML APPLICATOR (WOUND CARE) ×2 IMPLANT
ELECT REM PT RETURN 9FT ADLT (ELECTROSURGICAL) ×2
ELECTRODE REM PT RTRN 9FT ADLT (ELECTROSURGICAL) ×1 IMPLANT
GAUZE 4X4 16PLY ~~LOC~~+RFID DBL (SPONGE) ×1 IMPLANT
GAUZE SPONGE 4X4 12PLY STRL (GAUZE/BANDAGES/DRESSINGS) ×2 IMPLANT
GAUZE XEROFORM 1X8 LF (GAUZE/BANDAGES/DRESSINGS) ×3 IMPLANT
GLOVE SURG ENC MOIS LTX SZ7 (GLOVE) ×2 IMPLANT
GLOVE SURG UNDER LTX SZ7 (GLOVE) ×2 IMPLANT
GOWN STRL REUS W/ TWL LRG LVL3 (GOWN DISPOSABLE) ×3 IMPLANT
GOWN STRL REUS W/TWL LRG LVL3 (GOWN DISPOSABLE) ×6
KIT TURNOVER KIT A (KITS) ×2 IMPLANT
LABEL OR SOLS (LABEL) ×2 IMPLANT
MANIFOLD NEPTUNE II (INSTRUMENTS) ×2 IMPLANT
NEEDLE HYPO 22GX1.5 SAFETY (NEEDLE) ×2 IMPLANT
NS IRRIG 1000ML POUR BTL (IV SOLUTION) ×1 IMPLANT
NS IRRIG 500ML POUR BTL (IV SOLUTION) ×1 IMPLANT
PACK EXTREMITY ARMC (MISCELLANEOUS) ×2 IMPLANT
PAD PREP 24X41 OB/GYN DISP (PERSONAL CARE ITEMS) ×2 IMPLANT
PLATE FIBULA 13HOLE (Plate) ×1 IMPLANT
PLATE MEDIAL MALLEOLUS 2H RT (Plate) ×1 IMPLANT
SCREW LOCK PLATE R3 2.7X12 (Screw) ×1 IMPLANT
SCREW LOCK PLATE R3 2.7X13 (Screw) ×4 IMPLANT
SCREW LOCK PLATE R3 2.7X14 (Screw) ×2 IMPLANT
SCREW LOCK PLATE R3 2.7X17 (Screw) ×1 IMPLANT
SCREW LOCKING 2.7X34 (Screw) ×1 IMPLANT
SCREW NLOCK 2.7X40 (Screw) IMPLANT
SCREW NON-LOCKING 2.7X40 (Screw) ×2 IMPLANT
SPLINT CAST 1 STEP 4X30 (MISCELLANEOUS) ×1 IMPLANT
SPLINT CAST 1 STEP 5X30 WHT (MISCELLANEOUS) ×1 IMPLANT
SPLINT FAST PLASTER 5X30 (CAST SUPPLIES)
SPLINT PLASTER CAST FAST 5X30 (CAST SUPPLIES) ×1 IMPLANT
SPONGE T-LAP 18X18 ~~LOC~~+RFID (SPONGE) ×3 IMPLANT
STAPLER SKIN PROX 35W (STAPLE) ×2 IMPLANT
STOCKINETTE M/LG 89821 (MISCELLANEOUS) ×2 IMPLANT
STRAP SAFETY 5IN WIDE (MISCELLANEOUS) ×2 IMPLANT
STRIP CLOSURE SKIN 1/2X4 (GAUZE/BANDAGES/DRESSINGS) IMPLANT
SUT VIC AB 2-0 CT1 27 (SUTURE) ×2
SUT VIC AB 2-0 CT1 TAPERPNT 27 (SUTURE) ×1 IMPLANT
SUT VIC AB 3-0 SH 27 (SUTURE) ×8
SUT VIC AB 3-0 SH 27X BRD (SUTURE) ×1 IMPLANT
SWABSTK COMLB BENZOIN TINCTURE (MISCELLANEOUS) IMPLANT
SYNDESMOSIS TIGHTROPE XP (Orthopedic Implant) ×1 IMPLANT
SYR 10ML LL (SYRINGE) ×2 IMPLANT
SYR 50ML LL SCALE MARK (SYRINGE) ×2 IMPLANT
WATER STERILE IRR 500ML POUR (IV SOLUTION) ×2 IMPLANT
WIRE OLIVE SMOOTH 1.4MMX60MM (WIRE) ×2 IMPLANT

## 2021-05-27 NOTE — Anesthesia Procedure Notes (Addendum)
Anesthesia Regional Block: Popliteal block   Pre-Anesthetic Checklist: , timeout performed,  Correct Patient, Correct Site, Correct Laterality,  Correct Procedure, Correct Position, site marked,  Risks and benefits discussed,  Surgical consent,  Pre-op evaluation,  At surgeon's request and post-op pain management  Laterality: Lower and Right  Prep: chloraprep       Needles:  Injection technique: Single-shot  Needle Type: Echogenic Needle     Needle Length: 9cm  Needle Gauge: 21     Additional Needles:   Procedures:,,,, ultrasound used (permanent image in chart),,    Narrative:  Start time: 05/27/2021 12:50 PM End time: 05/27/2021 1:03 PM Injection made incrementally with aspirations every 5 mL.  Performed by: Personally  Anesthesiologist: Gwenna Fuston, Cleda Mccreedy, MD  Additional Notes: Right sided adductor PNB placed on right leg as well  Patient consented for risk and benefits of nerve block including but not limited to nerve damage, failed block, bleeding and infection.  Patient voiced understanding.  Functioning IV was confirmed and monitors were applied.  Timeout done prior to procedure and prior to any sedation being given to the patient.  Patient confirmed procedure site prior to any sedation given to the patient.  A 4mm 22ga Stimuplex needle was used. Sterile prep,hand hygiene and sterile gloves were used.  Minimal sedation used for procedure.  No paresthesia endorsed by patient during the procedure.  Negative aspiration and negative test dose prior to incremental administration of local anesthetic. The patient tolerated the procedure well with no immediate complications.

## 2021-05-27 NOTE — H&P (Signed)
PODIATRY / FOOT AND ANKLE SURGERY H&P NOTE  Chief Complaint: Right ankle fracture   HPI: Gregory Foster is a 40 y.o. male who presents with right ankle injury.  Patient presents today for fracture.  Bimalleolar ankle fracture with syndesmosis disruption.  Patient has been nonweightbearing on the foot since the injury and presents today with splint on in the preoperative area.  Patient denies nausea, vomiting, fever, chills today.   PMHx: History reviewed. No pertinent past medical history.  Surgical Hx:  Past Surgical History:  Procedure Laterality Date   BACK SURGERY  2010   lumbar    FHx: History reviewed. No pertinent family history.  Social History:  reports that he has never smoked. He quit smokeless tobacco use about 7 years ago. He reports current alcohol use. He reports that he does not use drugs.  Allergies: No Known Allergies  Review of Systems: General ROS: negative negative for - fatigue or fever Respiratory ROS: no cough, shortness of breath, or wheezing Cardiovascular ROS: no chest pain or dyspnea on exertion Gastrointestinal ROS: no abdominal pain, change in bowel habits, or black or bloody stools Musculoskeletal ROS: positive for - joint pain and joint swelling Neurological ROS: negative Dermatological ROS: negative  Medications Prior to Admission  Medication Sig Dispense Refill   ibuprofen (ADVIL) 800 MG tablet Take 800 mg by mouth every 8 (eight) hours as needed for moderate pain.     traMADol (ULTRAM) 50 MG tablet Take 50 mg by mouth every 8 (eight) hours as needed for severe pain.      Physical Exam: General: Alert and oriented.  No apparent distress.  Resp: Lungs clear to auscultation, no shortness of breath, no crackles.  CV: Regular rate rhythm, no murmur  Vascular: DP/PT pulses intact bilateral.  Capillary fill time intact to digits bilateral.  Mild swelling noted to the right ankle with some ecchymosis present.  Neuro: Light touch sensation intact  to digits bilateral.  Derm: No open lesions or sores, no fracture blisters  MSK: Deferred examination to right lower extremity due to fracture present.  Pain on palpation of the right ankle.  No obvious deformity present.  No results found for this or any previous visit (from the past 48 hour(s)). [place] OR NERVE BLOCK-IMAGE ONLY [facility])  Result Date: 05/27/2021 There is no interpretation for this exam.  This order is for images obtained during a surgical procedure.  Please See "Surgeries" Tab for more information regarding the procedure.    Blood pressure 138/89, pulse 75, temperature 98.4 F (36.9 C), temperature source Tympanic, resp. rate 19, height 5\' 11"  (1.803 m), weight (!) 136.1 kg, SpO2 96 %.   Assessment Right ankle bimalleolar ankle fracture with syndesmosis disruption  Plan -Patient seen. -Previous x-ray imaging was reviewed and discussed with patient detail showing fracture dislocation. -Discussed all treatment options with patient both conservative and surgical attempts at correction include potential risks and complications at this time patient is elected for surgery consisting of right ankle bimalleolar ankle fracture open reduction with internal fixation with syndesmosis repair.  No guarantees given.  Consent signed and placed in chart. -Patient to present for follow-up 1 week after surgery and is to remain nonweightbearing to right lower extremity at all times.  Stressed importance.  05/27/2021, 1:21 PM

## 2021-05-27 NOTE — Discharge Instructions (Addendum)
[facility]  POST OPERATIVE INSTRUCTIONS FOR DR. Ether Griffins AND DR. BAK[facility] PODIATRY DEPARTMENT   Take your medication as prescribed.  Pain medication should be taken only as needed.  Okay to take ibuprofen or Tylenol between pain medication doses.  If having severe and uncontrolled pain still after taking pain medicine may take 1 pain tablet every 4 hours instead of every 6.  If pain is still uncontrolled after that point then try taking 2 pain tablets every 4 hours.  Still try to take the pain medicine as prescribed.  Keep the dressing clean, dry and intact.  Remain nonweightbearing at all times to the right lower extremity.  Keep your foot elevated above the heart level for the first 48 hours.  Continue elevation thereafter.  You may also place ice to the anterior aspect of the right ankle for maximum 10 minutes out of every 1 hour as needed.  Take aspirin 81 mg twice daily starting tomorrow.  Take antibiotics as prescribed until gone.  Do not take a shower. Baths are permissible as long as the foot is kept out of the water.   Every hour you are awake:  Bend your knee 15 times. Massage calf 15 times  Call [facility] [phone]) if any of the following problems occur: You develop a temperature or fever. The bandage becomes saturated with blood. Medication does not stop your pain. Injury of the foot occurs. Any symptoms of infection including redness, odor, or red streaks running from wound. AMBULATORY SURGERY  DISCHARGE INSTRUCTIONS   The drugs that you were given will stay in your system until tomorrow so for the next 24 hours you should not:  Drive an automobile Make any legal decisions Drink any alcoholic beverage   You may resume regular meals tomorrow.  Today it is better to start with liquids and gradually work up to solid foods.  You may eat anything you prefer, but it is better to start with liquids, then  soup and crackers, and gradually work up to solid foods.   Please notify your doctor immediately if you have any unusual bleeding, trouble breathing, redness and pain at the surgery site, drainage, fever, or pain not relieved by medication.     Your post-operative visit with Dr.                                       is: Date:                        Time:    Please call to schedule your post-operative visit.  Additional Instructions:

## 2021-05-27 NOTE — Anesthesia Procedure Notes (Signed)
Procedure Name: Intubation Date/Time: 05/27/2021 1:39 PM Performed by: Berniece Pap, CRNA Pre-anesthesia Checklist: Patient identified, Emergency Drugs available, Suction available and Patient being monitored Patient Re-evaluated:Patient Re-evaluated prior to induction Oxygen Delivery Method: Circle system utilized Preoxygenation: Pre-oxygenation with 100% oxygen Induction Type: IV induction Ventilation: Mask ventilation without difficulty LMA: LMA inserted LMA Size: 4.5 Tube type: Oral Number of attempts: 1 Placement Confirmation: ETT inserted through vocal cords under direct vision, positive ETCO2 and breath sounds checked- equal and bilateral Tube secured with: Tape Dental Injury: Teeth and Oropharynx as per pre-operative assessment

## 2021-05-27 NOTE — H&P (Signed)
HISTORY AND PHYSICAL INTERVAL NOTE:  05/27/2021  1:09 PM  Gregory Foster  has presented today for surgery, with the diagnosis of S82.841A- Closed bimalleolar fracture of right ankle.  The various methods of treatment have been discussed with the patient.  No guarantees were given.  After consideration of risks, benefits and other options for treatment, the patient has consented to surgery.  I have reviewed the patients' chart and labs.    PROCEDURE: ALL LEFT BIMALLEOLAR ANKLE FRACTURE ORIF SYNDEMOSIS REPAIR   A history and physical examination was performed in my office.  The patient was reexamined.  There have been no changes to this history and physical examination.  Rosetta Posner, DPM

## 2021-05-27 NOTE — Transfer of Care (Signed)
Immediate Anesthesia Transfer of Care Note  Patient: Gregory Foster  Procedure(s) Performed: OPEN REDUCTION INTERNAL FIXATION (ORIF) ANKLE FRACTURE; SYNDEMOSIS REPAIR (Right: Ankle)  Patient Location: PACU  Anesthesia Type:General  Level of Consciousness: awake  Airway & Oxygen Therapy: Patient Spontanous Breathing and Patient connected to face mask oxygen  Post-op Assessment: Report given to RN and Post -op Vital signs reviewed and stable  Post vital signs: Reviewed and stable  Last Vitals:  Vitals Value Taken Time  BP 87/75 05/27/21 1636  Temp    Pulse 86 05/27/21 1639  Resp 15 05/27/21 1639  SpO2 92 % 05/27/21 1639  Vitals shown include unvalidated device data.  Last Pain:  Vitals:   05/27/21 1315  TempSrc:   PainSc: 0-No pain         Complications: No notable events documented.

## 2021-05-27 NOTE — Anesthesia Preprocedure Evaluation (Signed)
Anesthesia Evaluation  Patient identified by MRN, date of birth, ID band Patient awake    Reviewed: Allergy & Precautions, NPO status , Patient's Chart, lab work & pertinent test results  History of Anesthesia Complications Negative for: history of anesthetic complications  Airway Mallampati: III  TM Distance: >3 FB Neck ROM: full    Dental  (+) Chipped, Poor Dentition, Missing   Pulmonary neg pulmonary ROS, neg shortness of breath,    Pulmonary exam normal        Cardiovascular Exercise Tolerance: Good (-) angina(-) Past MI and (-) DOE negative cardio ROS Normal cardiovascular exam     Neuro/Psych negative neurological ROS  negative psych ROS   GI/Hepatic negative GI ROS, Neg liver ROS, neg GERD  ,  Endo/Other  negative endocrine ROS  Renal/GU      Musculoskeletal   Abdominal   Peds  Hematology negative hematology ROS (+)   Anesthesia Other Findings History reviewed. No pertinent past medical history.  Past Surgical History: 2010: BACK SURGERY     Comment:  lumbar  BMI    Body Mass Index: 41.85 kg/m      Reproductive/Obstetrics negative OB ROS                             Anesthesia Physical Anesthesia Plan  ASA: 3  Anesthesia Plan: General LMA   Post-op Pain Management: GA combined w/ Regional for post-op pain   Induction: Intravenous  PONV Risk Score and Plan: Dexamethasone, Ondansetron, Midazolam and Treatment may vary due to age or medical condition  Airway Management Planned: LMA  Additional Equipment:   Intra-op Plan:   Post-operative Plan: Extubation in OR  Informed Consent: I have reviewed the patients History and Physical, chart, labs and discussed the procedure including the risks, benefits and alternatives for the proposed anesthesia with the patient or authorized representative who has indicated his/her understanding and acceptance.     Dental  Advisory Given  Plan Discussed with: Anesthesiologist, CRNA and Surgeon  Anesthesia Plan Comments: (Patient consented for risks of anesthesia including but not limited to:  - adverse reactions to medications - damage to eyes, teeth, lips or other oral mucosa - nerve damage due to positioning  - sore throat or hoarseness - Damage to heart, brain, nerves, lungs, other parts of body or loss of life  Patient voiced understanding.)        Anesthesia Quick Evaluation

## 2021-05-27 NOTE — Op Note (Signed)
PODIATRY / FOOT AND ANKLE SURGERY OPERATIVE REPORT    SURGEON: Rosetta Posner, DPM  PRE-OPERATIVE DIAGNOSIS:  1.  Right bimalleolar displaced closed ankle fracture 2.  Right ankle syndesmosis disruption with ankle instability  POST-OPERATIVE DIAGNOSIS: Same  PROCEDURE(S): Right bimalleolar ankle fracture open reduction with internal fixation Right ankle syndesmosis repair  HEMOSTASIS: Right thigh tourniquet  ANESTHESIA: general  ESTIMATED BLOOD LOSS: 30 cc  FINDING(S): 1.  Comminuted displaced right lateral malleolar fracture at the distal shaft portion above the level of the syndesmosis 2.  Small avulsion type fracture off the right medial malleolus with displacement 3.  Unstable right ankle syndesmosis with disruption  PATHOLOGY/SPECIMEN(S): None  INDICATIONS:   Gregory Foster is a 40 y.o. male who presents with a displaced right bimalleolar ankle fracture with syndesmosis disruption.  Patient suffered the injury around a week ago and was seen by my partner Dr. Ether Griffins.  Patient was noted to have too much swelling at that time for any surgical intervention so he was placed in a Jones compressive dressing and splint.  Patient swelling has subsided since then and presents today for surgical intervention due to the pathology present.  All treatment options were discussed with the patient both conservative and surgical attempts at correction include potential risks and complications at this time patient is elected for procedure described above.  No guarantees given.  Consent obtained prior to procedure..  DESCRIPTION: After obtaining full informed written consent, the patient was brought back to the operating room and placed supine upon the operating table.  The patient received IV antibiotics prior to induction.  After obtaining adequate anesthesia, the patient was prepped and draped in the standard fashion.  A popliteal and saphenous nerve block was performed by anesthesia  preoperatively.  Attention was directed to the lateral malleolus where a linear longitudinal incision was made from the distal aspect of the fibula to the distal shaft portion parallel to the longitudinal axis of the leg.  This incision was marked out first under fluoroscopic guidance and then the incision was made.  The incision was deepened through the subcutaneous tissues utilizing sharp and blunt dissection and care was taken to identify and retract all vital neurovascular structures and all venous contributories were cauterized as necessary.  The superficial peroneal nerve was identified and retracted anteriorly and medially throughout the remainder the case.  At this time a periosteal incision was made into the distal fibula at the lateral malleolus and extended proximally along the shaft of the fibula.  The periosteum was reflected anteriorly and posteriorly thereby exposing the distal fibula at the operative site.  The fracture appeared to be long oblique in nature but did appear to have a butterfly fragment present as well.  Fracture was manipulated and cleaned out such that the periosteum was removed from the fracture fragments were then connected.  The distal fibula was then grasped at the lateral malleolus and pulled out to length while reducing the other fracture fragments with reduction clamps.  Adequate reduction appeared to be obtained clinically.  Radiographically this was also checked under fluoroscopic guidance and the fibula appeared to be out to length and near anatomic overall.  Once this was performed the medial malleolar fragment appeared to be well reduced.  At this time the distal fibular locking plate was then placed over the lateral malleolus and distal fibula.  Utilizing standard AO principles and techniques multiple locking screws were then placed in the appropriate holes with 3 locking 2.7 Paragon screws placed proximal  to the fracture site and 5 locking 2.7 Paragon screws distal to  the fracture site.  The most proximal hole that was present distal to the fracture was left open for syndesmotic fixation later in the case.  Fluoroscopic guidance was utilized to verify correct position of plate as well as screw sizes which appeared to be excellent overall.  Attention was then directed to the medial aspect of the right ankle where a linear longitudinal incision was made that was slightly curvilinear over the medial malleolus and medial ankle joint area.  The incision was deepened through the subcutaneous tissues utilizing sharp and blunt dissection care was taken to identify and retract all vital neurovascular structures no venous contributories were cauterized necessary.  The great saphenous vein was identified and retracted laterally throughout the remainder the case.  The periosteal incision was made into the medial malleolus.  The fracture fragment was noted and visualized.  Periosteum was then reflected from the fracture line area visualizing the fracture further and cleaning out any fracture fragments within the ankle joint.  After this was performed the medial malleolus was then held in a reduced position.  Once it was held in a reduced position it was pinned temporarily and checked under fluoroscopic guidance.  The ankle appeared to be sitting in a fairly rectus position overall with ankle mortise intact.  A Paragon 28 hook plate was then placed such that the prongs were dug into the small medial malleolar fragment and tamped into place and the 2 holes were placed more proximally within the distal tibia.  Fluoroscopic guidance was utilized to verify plate position which appeared to be excellent and reduction still appeared to be well-maintained.  At this time the locking screws in place to the most proximal hole which was a 2.7 x 40 mm screw.  The distal hole was then filled with a nonlocking screw that was 2.7 x 34 mm with the appropriate drill placement such that this was also a  compression screw within the plate.  Excellent compression was noted across the fracture site overall.  Fluoroscopic guidance was utilized to verify correct position which appeared to be excellent ankle mortise appeared to be intact.  A lateral cotton hook test was then performed and showed instability still within the syndesmosis region with slight gapping in the medial clear space as well as reduced tib-fib overlap.  Attention was then directed to the lateral fibula again where that hole that was saved for the syndesmosis was filled with the Arthrex tight rope system.  This was placed under fluoroscopic guidance utilizing standard manufactures protocol.  The suture buttons were then tightened down to the plate into the medial aspect of the tibia.  This was once again checked under fluoroscopic guidance and noted to be in excellent position overall.  The ankle was stressed in multiple planes as well as with cotton hook test again in the syndesmosis appeared to be well reduced.  Surgical sites were flushed with copious amounts normal sterile saline.  The periosteal and capsular structures were reapproximated well coapted both incision sites with 3-0 Vicryl.  Subcutaneous tissue and fascia was reapproximated well coapted with 3-0 Vicryl.  The skin was then reapproximated well coapted with a combination of 3-0 nylon and skin staples.  The pneumatic thigh tourniquet was deflated and a prompt hyperemic response was noted all digits of the right foot.    Postoperative dressing was applied consisting of Xeroform to the incision lines followed by 4 x 4 gauze, ABD,  Kerlix, web roll, posterior splint, Ace wrap.  The patient tolerated the procedure and anesthesia well and was transferred to the recovery room vital signs stable vascular status intact to all toes of the right foot.  Patient be discharged home with the appropriate orders and follow-up instructions.  Patient is to remain nonweightbearing at all times to  right lower extremity for the next 6 to 8 weeks.  Patient to follow-up in clinic within 1 week of surgical date.  COMPLICATIONS: None  CONDITION: Good, stable  Rosetta Posner, DPM

## 2021-05-28 NOTE — Anesthesia Postprocedure Evaluation (Signed)
Anesthesia Post Note  Patient: Gregory Foster  Procedure(s) Performed: OPEN REDUCTION INTERNAL FIXATION (ORIF) ANKLE FRACTURE; SYNDEMOSIS REPAIR (Right: Ankle)  Patient location during evaluation: PACU Anesthesia Type: General Level of consciousness: awake and alert Pain management: pain level controlled Vital Signs Assessment: post-procedure vital signs reviewed and stable Respiratory status: spontaneous breathing, nonlabored ventilation, respiratory function stable and patient connected to nasal cannula oxygen Cardiovascular status: blood pressure returned to baseline and stable Postop Assessment: no apparent nausea or vomiting Anesthetic complications: no   No notable events documented.   Last Vitals:  Vitals:   05/27/21 1830 05/27/21 1833  BP:    Pulse: 70 74  Resp:    Temp:    SpO2: 92% 93%    Last Pain:  Vitals:   05/27/21 1833  TempSrc:   PainSc: 0-No pain                 Lenard Simmer

## 2021-05-30 MED FILL — Ropivacaine HCl Inj 5 MG/ML: INTRAMUSCULAR | Qty: 30 | Status: AC

## 2021-05-31 ENCOUNTER — Encounter: Payer: Self-pay | Admitting: Podiatry

## 2022-07-07 IMAGING — CT CT HEART MORP W/ CTA COR W/ SCORE W/ CA W/CM &/OR W/O CM
4 of 7 series · 8 of 20 positions shown, 9 images · IV contrast (APPLIED)
Comparison: CT of the abdomen from October 10, 2007
COMPARISON: CT of the abdomen from October 10, 2007

Addendum:
EXAM:
OVER-READ INTERPRETATION  CT CHEST

The following report is an over-read performed by radiologist Dr.
Martin Maks Navakazi [REDACTED] on 07/08/2020. This
over-read does not include interpretation of cardiac or coronary
anatomy or pathology. The coronary calcium score/coronary CTA
interpretation by the cardiologist is attached.
CLINICAL DATA: Chest pain
Cardiac CTA
MEDICATIONS:
Sub lingual nitro. 4 mg and lopressor 25mg
TECHNIQUE: The patient was scanned on a Siemens Force 192 scanner. Gantry
rotation speed was 250 msecs. Collimation was. 6 mm . A 120 kV
prospective scan was triggered in the ascending thoracic aorta at
140 HU's with full mA between 30-70% of the R-R interval . Average
HR during the scan was 61 bpm. The 3D data set was interpreted on a
dedicated work station using MPR, MIP and VRT modes. A total of 80
cc of contrast was used.

[Series 6: best diast 75 % · axial · 0.41mm/px · z∈[-149,-104]mm · 2 of 338 slices shown]
[im 113/338  vessel]
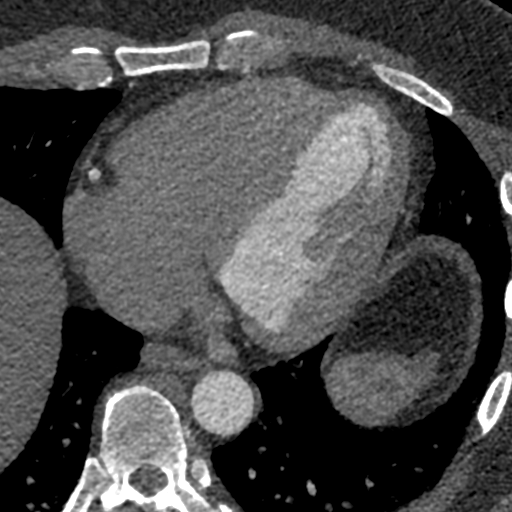
[im 225/338  vessel]
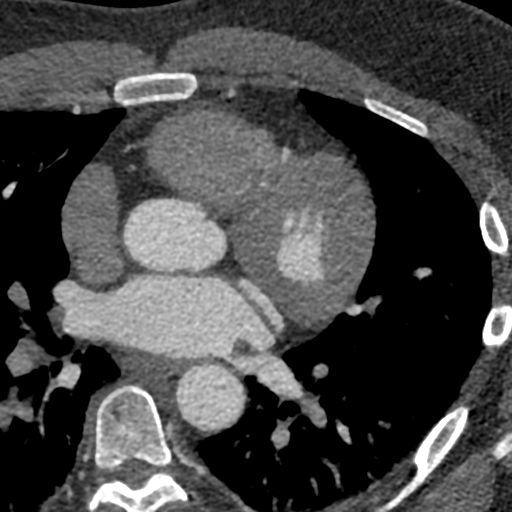

[Series 7: best syst · axial · 0.41mm/px · z∈[-149,-104]mm · 2 of 338 slices shown, 3 images]
[im 113/338  vessel]
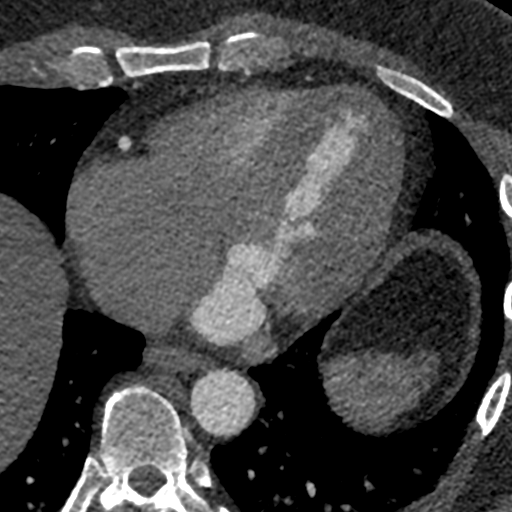
[im 113/338  lung]
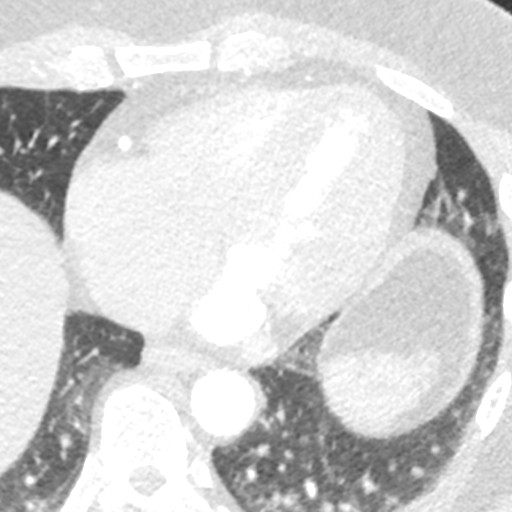
[im 225/338  vessel]
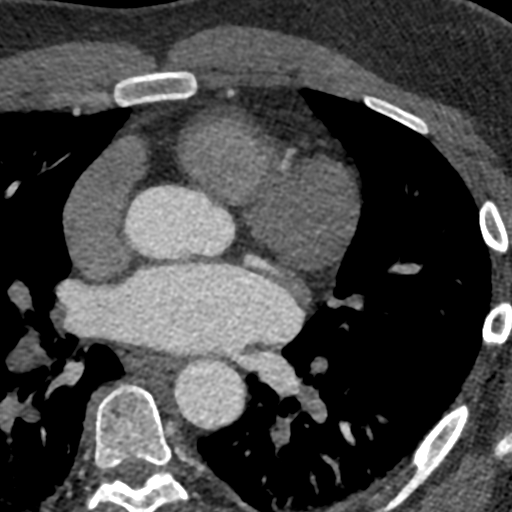

[Series 8: ts diast sharp · axial · 0.41mm/px · z∈[-149,-104]mm · 2 of 338 slices shown]
[im 113/338  lung]
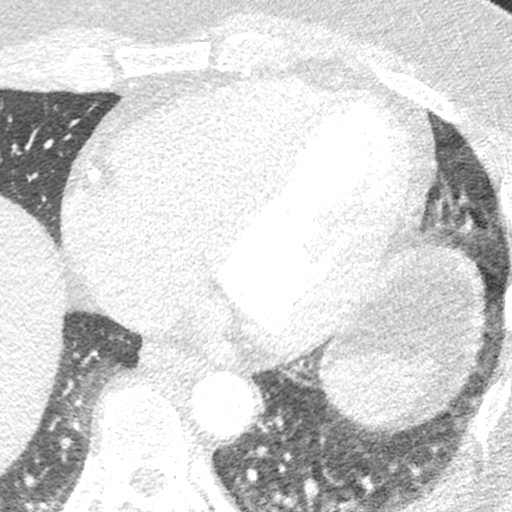
[im 225/338  lung]
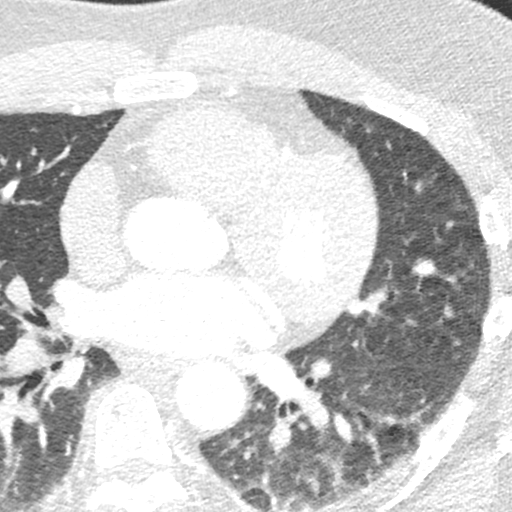

[Series 9: ts syst sharp · axial · 0.41mm/px · z∈[-149,-104]mm · 2 of 338 slices shown]
[im 113/338  lung]
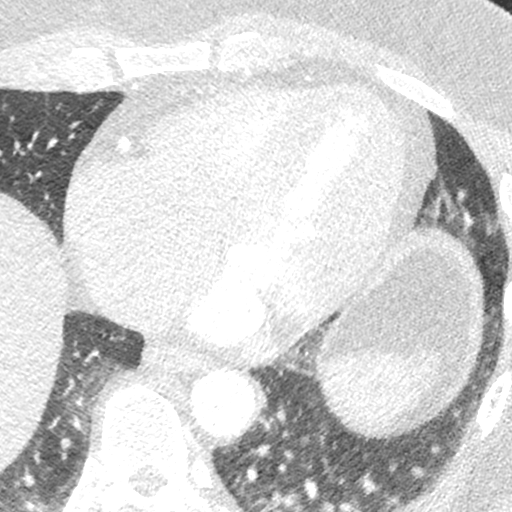
[im 225/338  lung]
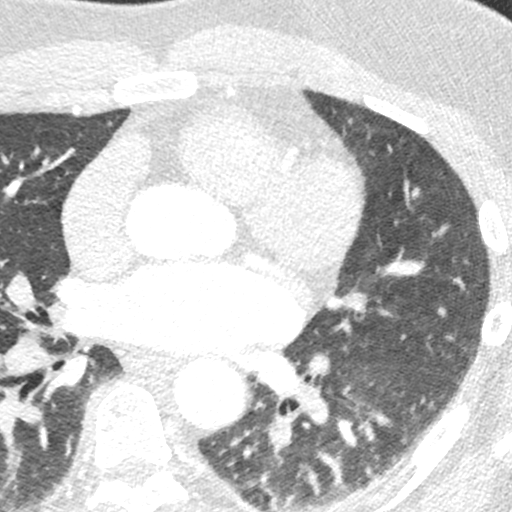

[8 of 20 positions shown; findings below may reference images not displayed]

FINDINGS: Vascular: Please see dedicated coronary and cardiac evaluation for
further detail. No pericardial effusion.

Mediastinum/Nodes: Mediastinal structures with limited assessment,
no signs of adenopathy. Esophagus, imaged portions grossly normal.

Lungs/Pleura: Basilar atelectasis. No dense consolidation. No
pleural effusion.

Upper Abdomen: Incidental imaging of upper abdominal contents is
unremarkable.

Musculoskeletal: No chest wall mass or suspicious bone lesions
identified.
IMPRESSION: 1. Basilar atelectasis.
2. Please see dedicated coronary and cardiac evaluation for further
detail.
FINDINGS: Non-cardiac: See separate report from [REDACTED]. No
significant findings on limited lung and soft tissue windows.

Calcium score: No calcium noted

Coronary Arteries: Right dominant with no anomalies

LM: Normal

LAD: Normal

D1: Normal

D2: Normal

Circumflex: Normal

OM1: Normal

OM2: Normal

RCA: Normal

PDA: Normal

PLA: Normal
IMPRESSION: 1. Calcium score 0

2.  Normal right dominant coronary arteries CAD RADS 0

3.  Normal aortic root 3.1 cm

Valda Herring

*** End of Addendum ***
EXAM:
OVER-READ INTERPRETATION  CT CHEST

The following report is an over-read performed by radiologist Dr.
Martin Maks Navakazi [REDACTED] on 07/08/2020. This
over-read does not include interpretation of cardiac or coronary
anatomy or pathology. The coronary calcium score/coronary CTA
interpretation by the cardiologist is attached.
FINDINGS: Vascular: Please see dedicated coronary and cardiac evaluation for
further detail. No pericardial effusion.

Mediastinum/Nodes: Mediastinal structures with limited assessment,
no signs of adenopathy. Esophagus, imaged portions grossly normal.

Lungs/Pleura: Basilar atelectasis. No dense consolidation. No
pleural effusion.

Upper Abdomen: Incidental imaging of upper abdominal contents is
unremarkable.

Musculoskeletal: No chest wall mass or suspicious bone lesions
identified.
IMPRESSION: 1. Basilar atelectasis.
2. Please see dedicated coronary and cardiac evaluation for further
detail.

## 2023-05-26 IMAGING — RF DG C-ARM 1-60 MIN
1 series · 4 of 4 positions shown · non-contrast
Comparison: None.

CLINICAL DATA: Right ankle ORIF

EXAM:
RIGHT ANKLE - 2 VIEW; DG C-ARM 1-60 MIN

[Series 1: run · 4 of 4 slices shown]
[im 1/4]
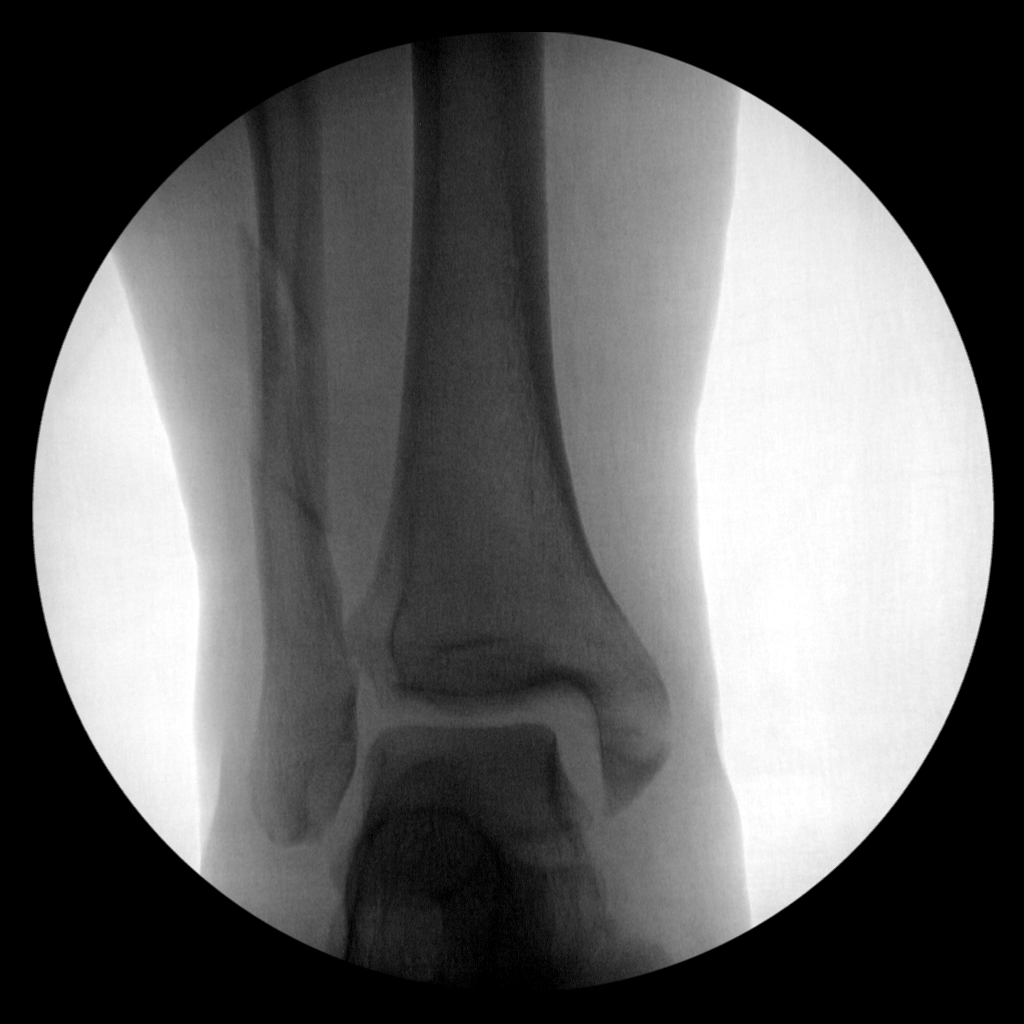
[im 2/4]
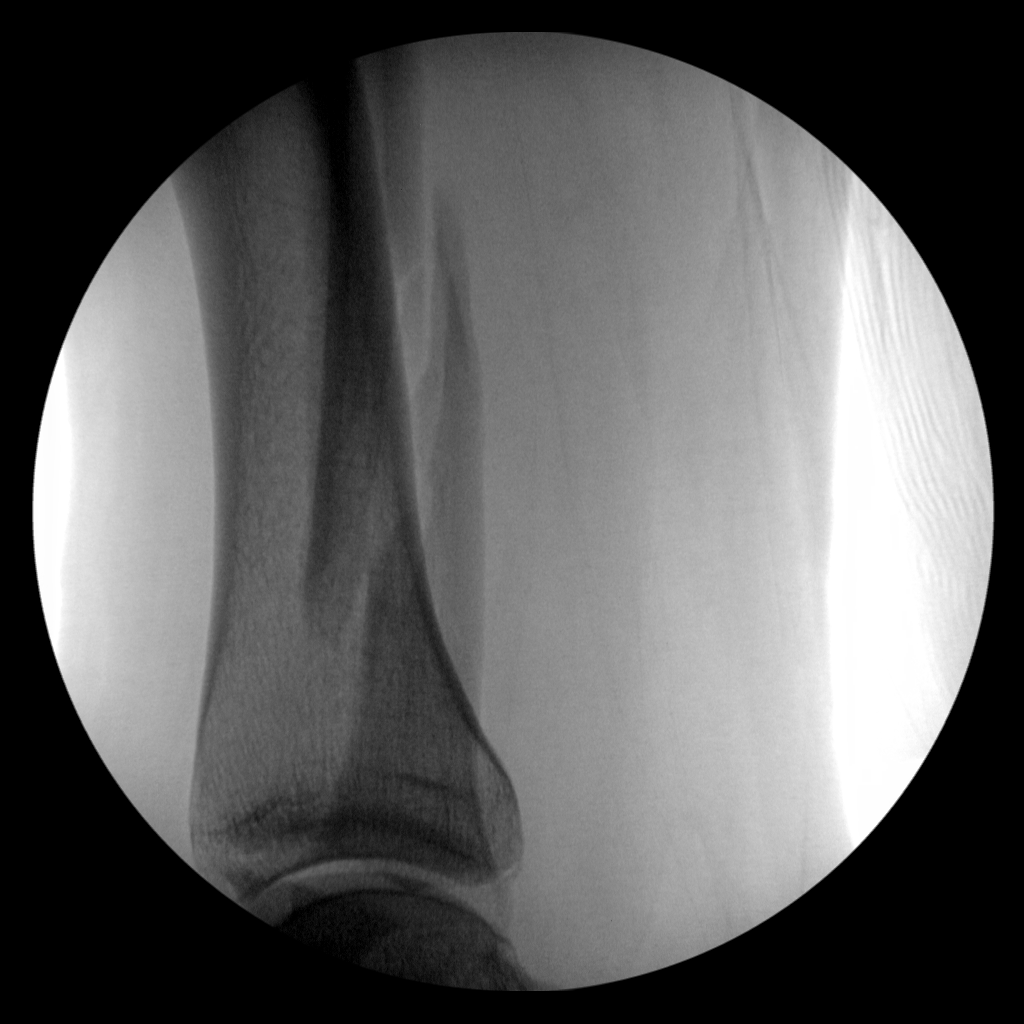
[im 3/4]
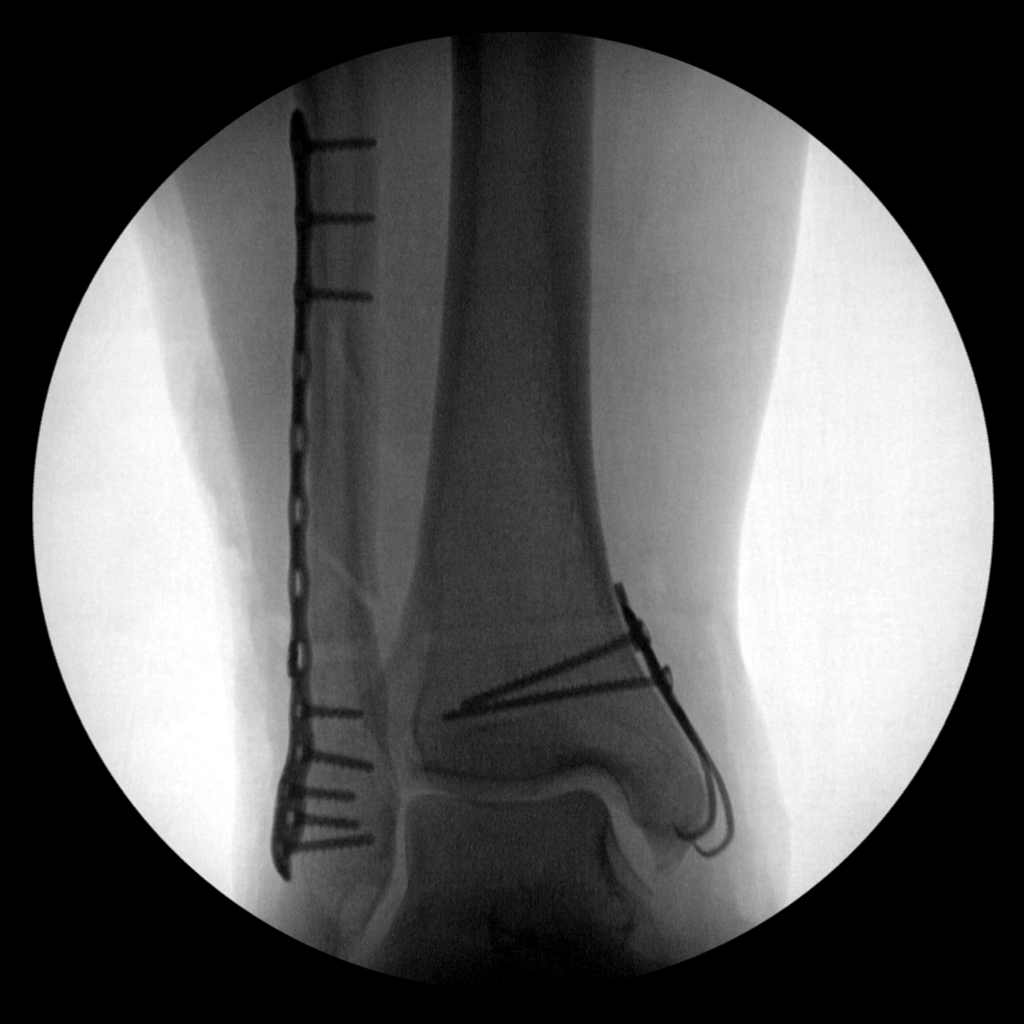
[im 4/4]
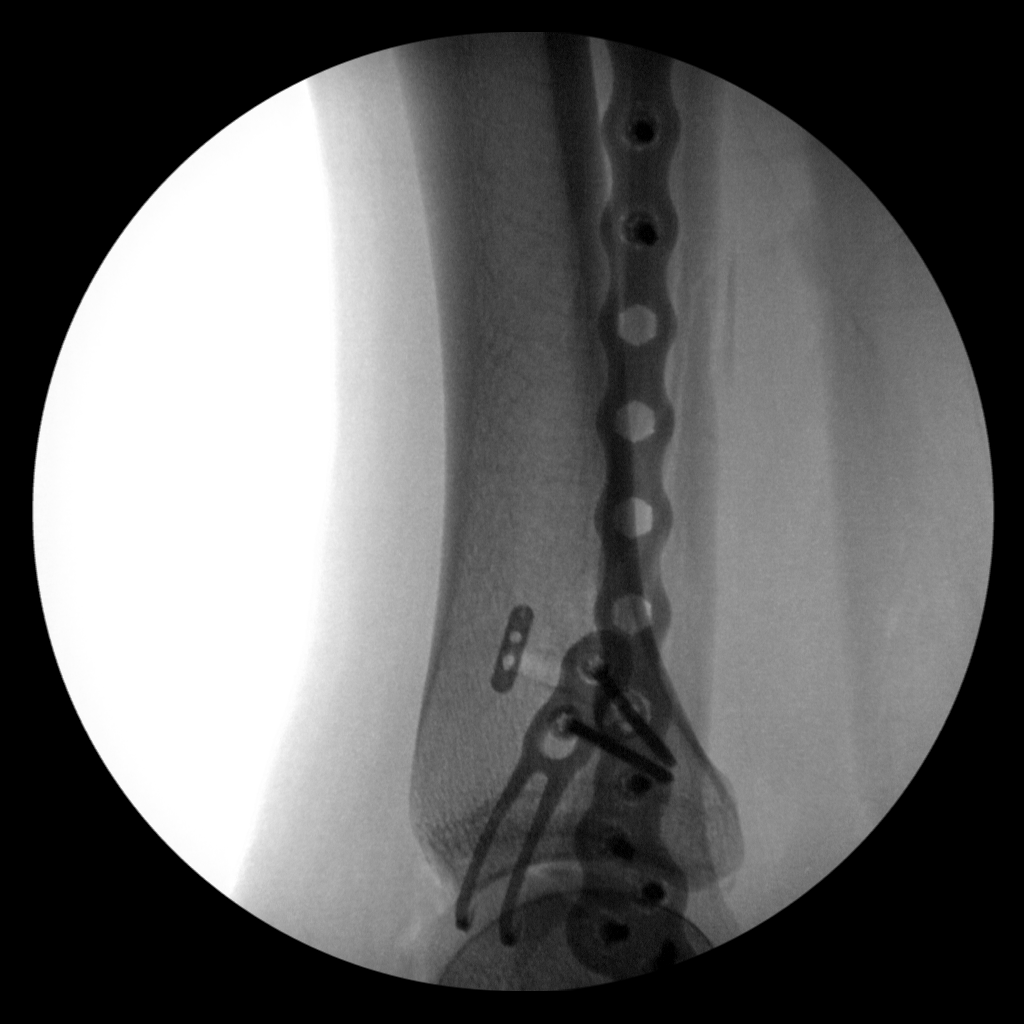

[4 of 4 positions shown; findings below may reference images not displayed]

FINDINGS: Multiple intraoperative fluoroscopic spot images are provided.
Interval ORIF of the distal fibular fracture transfixed with a
sideplate and interlocking screws. Medial malleolar fracture
transfixed with a sideplate and 2 interlocking screws. Syndesmotic
fixation device in satisfactory position.

FLUOROSCOPY TIME:  Fluoroscopy Time:  47 seconds

Number of Acquired Spot Images: 0
IMPRESSION: Interval ORIF of the medial malleolus and distal fibular
diametaphysis.
